# Patient Record
Sex: Female | Born: 1980 | Race: Asian | Hispanic: No | Marital: Married | Smoking: Never smoker
Health system: Southern US, Community
[De-identification: ages and names within clinical notes are randomized; demographics above are authoritative.]

## PROBLEM LIST (undated history)

## (undated) DIAGNOSIS — D649 Anemia, unspecified: Secondary | ICD-10-CM

## (undated) DIAGNOSIS — R51 Headache: Secondary | ICD-10-CM

## (undated) HISTORY — DX: Headache: R51

## (undated) HISTORY — DX: Anemia, unspecified: D64.9

---

## 2004-01-31 HISTORY — PX: TUMOR REMOVAL: SHX12

## 2010-09-03 ENCOUNTER — Emergency Department (HOSPITAL_COMMUNITY): Payer: Self-pay

## 2010-09-03 ENCOUNTER — Inpatient Hospital Stay (HOSPITAL_COMMUNITY)
Admission: EM | Admit: 2010-09-03 | Discharge: 2010-09-05 | DRG: 917 | Disposition: A | Discharge status: Home / Self Care | Payer: Self-pay | Attending: Pulmonary Disease | Admitting: Pulmonary Disease

## 2010-09-03 DIAGNOSIS — E872 Acidosis, unspecified: Secondary | ICD-10-CM | POA: Diagnosis present

## 2010-09-03 DIAGNOSIS — J9600 Acute respiratory failure, unspecified whether with hypoxia or hypercapnia: Secondary | ICD-10-CM | POA: Diagnosis present

## 2010-09-03 DIAGNOSIS — R404 Transient alteration of awareness: Secondary | ICD-10-CM | POA: Diagnosis present

## 2010-09-03 DIAGNOSIS — K2970 Gastritis, unspecified, without bleeding: Secondary | ICD-10-CM | POA: Diagnosis present

## 2010-09-03 DIAGNOSIS — T510X1A Toxic effect of ethanol, accidental (unintentional), initial encounter: Secondary | ICD-10-CM | POA: Diagnosis present

## 2010-09-03 DIAGNOSIS — D649 Anemia, unspecified: Secondary | ICD-10-CM | POA: Diagnosis present

## 2010-09-03 DIAGNOSIS — T65891A Toxic effect of other specified substances, accidental (unintentional), initial encounter: Secondary | ICD-10-CM

## 2010-09-03 DIAGNOSIS — T510X4A Toxic effect of ethanol, undetermined, initial encounter: Secondary | ICD-10-CM

## 2010-09-03 LAB — DIFFERENTIAL
Basophils Relative: 1 % (ref 0–1)
Eosinophils Relative: 3 % (ref 0–5)
Lymphs Abs: 3.5 10*3/uL (ref 0.7–4.0)
Monocytes Absolute: 0.4 10*3/uL (ref 0.1–1.0)
Monocytes Relative: 5 % (ref 3–12)
Neutro Abs: 4.5 10*3/uL (ref 1.7–7.7)

## 2010-09-03 LAB — POCT I-STAT, CHEM 8
BUN: 11 mg/dL (ref 6–23)
Calcium, Ion: 1.01 mmol/L — ABNORMAL LOW (ref 1.12–1.32)
Chloride: 113 mEq/L — ABNORMAL HIGH (ref 96–112)
Creatinine, Ser: 1 mg/dL (ref 0.50–1.10)
Glucose, Bld: 104 mg/dL — ABNORMAL HIGH (ref 70–99)
Potassium: 3.3 mEq/L — ABNORMAL LOW (ref 3.5–5.1)

## 2010-09-03 LAB — URINALYSIS, ROUTINE W REFLEX MICROSCOPIC
Bilirubin Urine: NEGATIVE
Hgb urine dipstick: NEGATIVE
Nitrite: NEGATIVE
Protein, ur: NEGATIVE mg/dL
Specific Gravity, Urine: 1.014 (ref 1.005–1.030)
Urobilinogen, UA: 0.2 mg/dL (ref 0.0–1.0)

## 2010-09-03 LAB — GLUCOSE, CAPILLARY

## 2010-09-03 LAB — BASIC METABOLIC PANEL
CO2: 16 mEq/L — ABNORMAL LOW (ref 19–32)
Chloride: 109 mEq/L (ref 96–112)
Glucose, Bld: 112 mg/dL — ABNORMAL HIGH (ref 70–99)
Potassium: 3.2 mEq/L — ABNORMAL LOW (ref 3.5–5.1)
Sodium: 142 mEq/L (ref 135–145)

## 2010-09-03 LAB — BLOOD GAS, ARTERIAL
Bicarbonate: 13.5 mEq/L — ABNORMAL LOW (ref 20.0–24.0)
Drawn by: 33686
O2 Saturation: 99.5 %
PEEP: 5 cmH2O
Patient temperature: 94.9
RATE: 18 resp/min
pH, Arterial: 7.42 — ABNORMAL HIGH (ref 7.350–7.400)
pO2, Arterial: 356 mmHg — ABNORMAL HIGH (ref 80.0–100.0)

## 2010-09-03 LAB — RAPID URINE DRUG SCREEN, HOSP PERFORMED
Amphetamines: NOT DETECTED
Barbiturates: NOT DETECTED
Benzodiazepines: NOT DETECTED
Cocaine: NOT DETECTED
Opiates: NOT DETECTED
Tetrahydrocannabinol: NOT DETECTED

## 2010-09-03 LAB — CBC
HCT: 32.5 % — ABNORMAL LOW (ref 36.0–46.0)
Hemoglobin: 10.8 g/dL — ABNORMAL LOW (ref 12.0–15.0)
MCHC: 33.2 g/dL (ref 30.0–36.0)
MCV: 65 fL — ABNORMAL LOW (ref 78.0–100.0)
RDW: 14.6 % (ref 11.5–15.5)

## 2010-09-03 LAB — SALICYLATE LEVEL: Salicylate Lvl: <2 mg/dL — ABNORMAL LOW (ref 2.8–20.0)

## 2010-09-04 DIAGNOSIS — J9600 Acute respiratory failure, unspecified whether with hypoxia or hypercapnia: Secondary | ICD-10-CM

## 2010-09-04 DIAGNOSIS — E872 Acidosis: Secondary | ICD-10-CM

## 2010-09-04 LAB — BLOOD GAS, ARTERIAL
Acid-base deficit: 7.4 mmol/L — ABNORMAL HIGH (ref 0.0–2.0)
Bicarbonate: 15.9 mEq/L — ABNORMAL LOW (ref 20.0–24.0)
Drawn by: 308601
FIO2: 0.4 %
O2 Saturation: 99.1 %
PEEP: 5 cmH2O
TCO2: 14.9 mmol/L (ref 0–100)
TCO2: 15.4 mmol/L (ref 0–100)
pCO2 arterial: 28.1 mmHg — ABNORMAL LOW (ref 35.0–45.0)
pO2, Arterial: 206 mmHg — ABNORMAL HIGH (ref 80.0–100.0)
pO2, Arterial: 214 mmHg — ABNORMAL HIGH (ref 80.0–100.0)

## 2010-09-04 LAB — BASIC METABOLIC PANEL
BUN: 4 mg/dL — ABNORMAL LOW (ref 6–23)
CO2: 22 mEq/L (ref 19–32)
Calcium: 7.3 mg/dL — ABNORMAL LOW (ref 8.4–10.5)
Calcium: 9.3 mg/dL (ref 8.4–10.5)
GFR calc non Af Amer: >60 mL/min (ref >60)
GFR calc non Af Amer: >60 mL/min (ref >60)
Glucose, Bld: 108 mg/dL — ABNORMAL HIGH (ref 70–99)
Potassium: 3.4 mEq/L — ABNORMAL LOW (ref 3.5–5.1)
Sodium: 140 mEq/L (ref 135–145)

## 2010-09-04 LAB — CBC
Hemoglobin: 9 g/dL — ABNORMAL LOW (ref 12.0–15.0)
MCH: 21.2 pg — ABNORMAL LOW (ref 26.0–34.0)
RBC: 4.25 MIL/uL (ref 3.87–5.11)

## 2010-09-04 LAB — PHOSPHORUS: Phosphorus: 3.2 mg/dL (ref 2.3–4.6)

## 2010-09-04 LAB — MAGNESIUM: Magnesium: 1.5 mg/dL (ref 1.5–2.5)

## 2010-09-04 LAB — LACTIC ACID, PLASMA: Lactic Acid, Venous: 3.3 mmol/L — ABNORMAL HIGH (ref 0.5–2.2)

## 2010-09-04 LAB — GLUCOSE, CAPILLARY: Glucose-Capillary: 100 mg/dL — ABNORMAL HIGH (ref 70–99)

## 2010-09-04 LAB — MRSA PCR SCREENING: MRSA by PCR: NEGATIVE

## 2010-09-04 LAB — PROTIME-INR
INR: 1.25 (ref 0.00–1.49)
Prothrombin Time: 16 seconds — ABNORMAL HIGH (ref 11.6–15.2)

## 2010-09-05 LAB — BLOOD GAS, ARTERIAL
Bicarbonate: 13.1 mEq/L — ABNORMAL LOW (ref 20.0–24.0)
Drawn by: 336861
PEEP: 5 cmH2O

## 2010-10-27 NOTE — Discharge Summary (Signed)
  [name], [name] NO.:  [id number]  MEDICAL RECORD NO.:  [id number]  LOCATION:  1237                         FACILITY:  [facility]  PHYSICIAN:  Oretha Milch, MD      DATE OF BIRTH:  1980/10/18  DATE OF ADMISSION:  09/03/2010 DATE OF DISCHARGE:  09/05/2010                              DISCHARGE SUMMARY   DISCHARGE DIAGNOSIS:  Acute respiratory failure secondary to alcohol overdose.  HISTORY OF PRESENT ILLNESS:  A 30 year old female with no significant past medical history.  She was brought in by her friends due to vomiting, nonresponsive after drinking vodka at a bachelor party.  Lines were none.  Antibiotics were none.  She has no known health problems prior to admission.  Hemoglobin 9.0, hematocrit 27.6.  PT is 16.0. Bicarbonate 16.  Glucose 154, calcium 7.3, and ionized calcium is 1.01. Lactate is 3.3.  WBC is 5.8, platelets are 183.  INR is 1.25, PTT is 29. Sodium 140, potassium 3.7, chloride 109.  BUN is 8, creatinine 0.63. Magnesium is 1.5, phosphorus 3.2, acetaminophen is less than 15.  ABG is pH 7.302, pCO2 of 20, and paO2 of 214, bicarbonate of 16.3.  HOSPITAL COURSE BY DISCHARGE DIAGNOSES: 1. Acute respiratory failure secondary to alcohol toxication.  She was     liberated mechanical respiratory support in less than 12 hours and     ready for discharge home. 2. Altered mental status.  This was secondary to ethanol overdose     which resolved. 3. Metabolic acidosis which resolved with rehydration. 4. Vomiting which resolved after being extubated.  DISCHARGE MEDICATIONS:  None.  DISCHARGE INSTRUCTIONS:  She has been instructed not to drink heavily. She is discharged on improved conditions, on a regular diet.     Devra Dopp, MSN, ACNP   ______________________________ Oretha Milch, MD    SM/MEDQ  D:  10/11/2010  T:  10/11/2010  Job:  [id number]  Electronically Signed by Devra Dopp MSN ACNP on 10/15/2010 01:06:45 PM Electronically Signed  by Cyril Mourning MD on 10/27/2010 03:32:38 PM

## 2012-09-02 LAB — US OB COMP LESS 14 WKS

## 2012-09-09 LAB — OB RESULTS CONSOLE HEPATITIS B SURFACE ANTIGEN: Hepatitis B Surface Ag: NEGATIVE

## 2012-09-09 LAB — OB RESULTS CONSOLE GC/CHLAMYDIA
CHLAMYDIA, DNA PROBE: NEGATIVE
GC PROBE AMP, GENITAL: NEGATIVE

## 2012-09-09 LAB — OB RESULTS CONSOLE RPR: RPR: NONREACTIVE

## 2012-09-09 LAB — OB RESULTS CONSOLE RUBELLA ANTIBODY, IGM: RUBELLA: IMMUNE

## 2012-09-09 LAB — OB RESULTS CONSOLE ABO/RH: RH Type: POSITIVE

## 2012-09-09 LAB — OB RESULTS CONSOLE ANTIBODY SCREEN: Antibody Screen: NEGATIVE

## 2012-09-09 LAB — OB RESULTS CONSOLE HIV ANTIBODY (ROUTINE TESTING): HIV: NONREACTIVE

## 2012-10-02 LAB — US OB COMP LESS 14 WKS

## 2012-10-04 ENCOUNTER — Telehealth: Payer: Self-pay | Admitting: Hematology & Oncology

## 2012-10-04 NOTE — Telephone Encounter (Signed)
Called pt to schedule appointment she was not aware of referral. She took down my information and said she would call me after she talks to referring. I left message on referral contact voice mail that pt is not aware of referral.

## 2012-10-07 ENCOUNTER — Telehealth: Payer: Self-pay | Admitting: Hematology & Oncology

## 2012-10-07 NOTE — Telephone Encounter (Signed)
Pt aware of 9-24 appointment °

## 2012-10-14 ENCOUNTER — Telehealth: Payer: Self-pay | Admitting: Hematology & Oncology

## 2012-10-14 NOTE — Telephone Encounter (Signed)
Left pt message moved 9-24 to 9-29

## 2012-10-23 ENCOUNTER — Ambulatory Visit: Payer: Self-pay | Admitting: Hematology & Oncology

## 2012-10-23 ENCOUNTER — Other Ambulatory Visit: Payer: Self-pay | Admitting: Lab

## 2012-10-23 ENCOUNTER — Ambulatory Visit: Payer: Self-pay

## 2012-10-28 ENCOUNTER — Other Ambulatory Visit (HOSPITAL_BASED_OUTPATIENT_CLINIC_OR_DEPARTMENT_OTHER): Payer: Self-pay | Admitting: Lab

## 2012-10-28 ENCOUNTER — Ambulatory Visit (HOSPITAL_BASED_OUTPATIENT_CLINIC_OR_DEPARTMENT_OTHER): Payer: BC Managed Care – PPO | Admitting: Hematology & Oncology

## 2012-10-28 ENCOUNTER — Ambulatory Visit: Payer: BC Managed Care – PPO

## 2012-10-28 VITALS — BP 134/57 | HR 70 | Temp 98.4°F | Resp 14 | Ht 64.0 in | Wt 154.0 lb

## 2012-10-28 DIAGNOSIS — D569 Thalassemia, unspecified: Secondary | ICD-10-CM

## 2012-10-28 DIAGNOSIS — Z331 Pregnant state, incidental: Secondary | ICD-10-CM

## 2012-10-28 DIAGNOSIS — D563 Thalassemia minor: Secondary | ICD-10-CM

## 2012-10-28 LAB — CBC WITH DIFFERENTIAL (CANCER CENTER ONLY)
Eosinophils Absolute: 0.2 10*3/uL (ref 0.0–0.5)
LYMPH#: 1.8 10*3/uL (ref 0.9–3.3)
MCH: 21.7 pg — ABNORMAL LOW (ref 26.0–34.0)
MCV: 66 fL — ABNORMAL LOW (ref 81–101)
MONO#: 0.7 10*3/uL (ref 0.1–0.9)
MONO%: 5.9 % (ref 0.0–13.0)
NEUT#: 9.4 10*3/uL — ABNORMAL HIGH (ref 1.5–6.5)
Platelets: 280 10*3/uL (ref 145–400)
RBC: 4.71 10*6/uL (ref 3.70–5.32)
WBC: 12.1 10*3/uL — ABNORMAL HIGH (ref 3.9–10.0)

## 2012-10-28 LAB — TECHNOLOGIST REVIEW CHCC SATELLITE

## 2012-10-28 NOTE — Progress Notes (Signed)
This office note has been dictated.

## 2012-10-29 NOTE — Progress Notes (Signed)
CC:   Guy Sandifer. Henderson Cloud, M.D.  DIAGNOSES: 1. Sixteen weeks pregnant. 2. Beta thalassemia minor.  HISTORY OF PRESENT ILLNESS:  Kaylee [name] is a very charming 32 year old female from [place].  She is married.  She and her husband have been married for about 2 years.  She is from [place] originally.  Her father apparently has thalassemia.  As far as I can tell, this is thalassemia major.  From what she says, he has had a lot of transfusions.  He has iron overload.  He is having a lot of health issues right now.  She is followed by Dr. Henderson Cloud.  She is [redacted] weeks pregnant.  She has never had any kind of blood issues.  She has never been told that she has been anemic.  She has had normal monthly cycles.  These have been okay.  She has had no problems with rashes.  There has been no bony pain.  She has had no headaches.  There have been no swallowing difficulties.  She does not chew ice.  The patient currently takes prenatal vitamins without difficulty.  Dr. Henderson Cloud wanted her to be seen by Hematology because of this history of thalassemia in the family.  She has 2, I think, sisters and a brother.  As far she knows, they have had no problems.  Back in August, she did have a hemoglobin electrophoresis done.  This showed a hemoglobin A of 95%.  She had a minimally elevated hemoglobin A2 level of 4.7%.  There was no hemoglobin S.  She was checked for the routine hepatitis studies.  She is negative for hepatitis.  She is negative for HIV.  Again, she has had no issues with respect to her blood as far she knows.  She works as a Social worker.  She has had no problems with increase in fatigue.  There has been no cough.  She has had no shortness of breath.  She is not a vegetarian.  Again, she is [redacted] weeks pregnant.  She is due in March.  So far, the pregnancy has gone unaffected.  Her past medical history is relatively unremarkable.  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  ALLERGIES:  None.  CURRENT MEDICATIONS:  Prenatal vitamins daily.  SOCIAL HISTORY:  Negative for tobacco use.  There was rare alcohol use prior to pregnancy.  There is no recreational drug use.  FAMILY HISTORY:  Remarkable for her father with thalassemia.  She says her mother, who is also from [place], is okay as far she knows.  Her siblings are healthy.  REVIEW OF SYSTEMS:  As stated in the history of present illness.  A 12- system review is negative.  PHYSICAL EXAMINATION:  General:  This is a well-developed, well- nourished Asian female in no obvious distress.  Vital signs: Temperature of 98.4, pulse 70, respiratory rate 14, blood pressure 134/57.  Weight is 154 pounds.  Head and neck:  Normocephalic, atraumatic skull.  There are no ocular or oral lesions.  There are no palpable cervical or supraclavicular lymph nodes.  Lungs:  Clear bilaterally.  Cardiac:  Regular rate and rhythm with a normal S1 and S2. There are no murmurs, rubs, or bruits.  Abdomen:  Soft.  One cannot even tell that she is pregnant as of yet.  There is no fluid wave.  There is no guarding or rebound tenderness.  She has no palpable  hepatosplenomegaly.  Back:  No tenderness over the spine, ribs, or hips. Extremities:  No clubbing, cyanosis, or edema.  She has good range of motion of her joints.  She has good strength in her arms and legs. Skin:  No rashes, ecchymoses, or petechiae.  Neurological:  No focal neurological deficits.  LABORATORY STUDIES:  White cell count is 12.1, hemoglobin 10.2, hematocrit 31, platelet count 280,000.  MCV is 66.  Reticulocyte count is 1.8% (corrected).  Peripheral smear, which I reviewed, shows a microcytic population of red blood cells.  There are some hypochromic red cells.  I do see some target cells.  I see no rouleaux formation.  There are no schistocytes. There are no spherocytes.  I see no inclusion bodies.  She  has no teardrop cells.  White cells appear normal in morphology and maturation. White cells might be slightly increased in number; however, they appear mature.  I see no immature myeloid cells.  There are no atypical lymphocytes.  I see no blasts.  Platelets are adequate in number and size.  IMPRESSION:  Ms. Haver is a very charming 32 year old [place] female.  She has heterozygote thalassemia minor.  This is beta thalassemia.  She is asymptomatic with this.  I really do not see any issues with her and the pregnancy.  With her husband, being Caucasian, her child should have very little, if any, hemoglobin A2.  As such, I do not see any issues with respect to her infant and thalassemia.  She may be iron deficient.  I am checking some iron studies on her.  I was not too impressed with her blood smear.  I do not see a lot of target cells.  She had a lot of hypochromic, microcytic cells.  The fact that her red cell distribution width is normal would make iron deficiency less likely.  Typically, with iron deficiency, the red cell distribution width is increased.  From my point of view, I do not think we have to see her back.  I just do not believe that the thalassemia minor is going to be an issue for her.  Her thalassemia hemoglobin (A2) is minimal at best.  I think the big thing with Ms. [place] is that she take her prenatal vitamins.  After her pregnancy, I would have her take folic acid 1 mg a day which she can just take over-the-counter.  She has monthly phlebotomies, so that clearly is going to take care of any iron issues that she may have from ineffective erythropoiesis from the thalassemia.  Again, I think this would be a very minor component for her.  I suspect that she always will be anemic.  However, this anemia as well tolerated by her.  She is asymptomatic with this.  It would not surprise me if her hemoglobin went down with her pregnancy. Again, I do not  see that the thalassemia minor is going to be a factor with respect to any kind of blood support.  I spent a good hour and 20 minutes with Mr. Leon and her husband. I reviewed the lab work with him.  I went over thalassemia with them, explained to them what beta thalassemia was.  I reassured her that I just did not think that she was going to have any problems with beta thalassemia, although she may always have some degree of anemia that she should not be symptomatic from.    ______________________________ Josph Macho, M.D. PRE/MEDQ  D:  10/28/2012  T:  10/29/2012  Job:  [id number]

## 2012-10-30 LAB — HEMOGLOBINOPATHY EVALUATION
Hgb A2 Quant: 4.8 % — ABNORMAL HIGH (ref 2.2–3.2)
Hgb A: 94.4 % — ABNORMAL LOW (ref 96.8–97.8)
Hgb F Quant: 0.8 % (ref 0.0–2.0)
Hgb S Quant: 0 %

## 2012-10-30 LAB — FOLATE RBC: RBC Folate: 1264 ng/mL

## 2012-10-30 LAB — RETICULOCYTES (CHCC)
ABS Retic: 96.2 K/uL (ref 19.0–186.0)
RBC.: 4.81 MIL/uL (ref 3.87–5.11)
Retic Ct Pct: 2 % (ref 0.4–2.3)

## 2013-03-20 LAB — OB RESULTS CONSOLE GBS: GBS: NEGATIVE

## 2013-04-14 ENCOUNTER — Inpatient Hospital Stay (HOSPITAL_COMMUNITY): Admission: AD | Admit: 2013-04-14 | Payer: Self-pay | Source: Ambulatory Visit | Admitting: Obstetrics and Gynecology

## 2013-04-14 ENCOUNTER — Telehealth (HOSPITAL_COMMUNITY): Payer: Self-pay | Admitting: *Deleted

## 2013-04-14 ENCOUNTER — Encounter (HOSPITAL_COMMUNITY): Payer: Self-pay | Admitting: *Deleted

## 2013-04-14 NOTE — Telephone Encounter (Signed)
Preadmission screen  

## 2013-04-16 ENCOUNTER — Encounter (HOSPITAL_COMMUNITY): Payer: Self-pay

## 2013-04-16 ENCOUNTER — Encounter (HOSPITAL_COMMUNITY)
Admission: RE | Disposition: A | Discharge status: Home / Self Care | Payer: Self-pay | Source: Ambulatory Visit | Attending: Obstetrics and Gynecology

## 2013-04-16 ENCOUNTER — Encounter (HOSPITAL_COMMUNITY): Payer: BC Managed Care – PPO | Admitting: Anesthesiology

## 2013-04-16 ENCOUNTER — Encounter (HOSPITAL_COMMUNITY): Payer: Self-pay | Admitting: Anesthesiology

## 2013-04-16 ENCOUNTER — Inpatient Hospital Stay (HOSPITAL_COMMUNITY): Payer: BC Managed Care – PPO | Admitting: Anesthesiology

## 2013-04-16 ENCOUNTER — Inpatient Hospital Stay (HOSPITAL_COMMUNITY)
Admission: RE | Admit: 2013-04-16 | Discharge: 2013-04-18 | DRG: 766 | Disposition: A | Discharge status: Home / Self Care | Payer: BC Managed Care – PPO | Source: Ambulatory Visit | Attending: Obstetrics and Gynecology | Admitting: Obstetrics and Gynecology

## 2013-04-16 DIAGNOSIS — O324XX0 Maternal care for high head at term, not applicable or unspecified: Secondary | ICD-10-CM | POA: Diagnosis present

## 2013-04-16 LAB — CBC
HCT: 33.4 % — ABNORMAL LOW (ref 36.0–46.0)
Hemoglobin: 10.8 g/dL — ABNORMAL LOW (ref 12.0–15.0)
MCH: 22.1 pg — AB (ref 26.0–34.0)
MCHC: 32.3 g/dL (ref 30.0–36.0)
MCV: 68.3 fL — AB (ref 78.0–100.0)
PLATELETS: 253 10*3/uL (ref 150–400)
RBC: 4.89 MIL/uL (ref 3.87–5.11)
RDW: 15.6 % — AB (ref 11.5–15.5)
WBC: 12.8 10*3/uL — ABNORMAL HIGH (ref 4.0–10.5)

## 2013-04-16 LAB — ABO/RH: ABO/RH(D): B POS

## 2013-04-16 LAB — TYPE AND SCREEN
ABO/RH(D): B POS
ANTIBODY SCREEN: NEGATIVE

## 2013-04-16 LAB — RPR: RPR Ser Ql: NONREACTIVE

## 2013-04-16 SURGERY — Surgical Case
Anesthesia: Epidural | Site: Abdomen

## 2013-04-16 MED ORDER — ONDANSETRON HCL 4 MG PO TABS: 4.0000 mg | ORAL_TABLET | ORAL | Status: DC | PRN

## 2013-04-16 MED ORDER — LACTATED RINGERS IV SOLN: 500.0000 mL | INTRAVENOUS | Status: DC | PRN

## 2013-04-16 MED ORDER — CITRIC ACID-SODIUM CITRATE 334-500 MG/5ML PO SOLN
30.0000 mL | ORAL | Status: DC | PRN
  Administered 2013-04-16: 30 mL via ORAL
  Filled 2013-04-16: qty 15

## 2013-04-16 MED ORDER — LACTATED RINGERS IV SOLN
INTRAVENOUS | Status: DC
  Administered 2013-04-17: 04:00:00 via INTRAVENOUS

## 2013-04-16 MED ORDER — OXYTOCIN 40 UNITS IN LACTATED RINGERS INFUSION - SIMPLE MED: 62.5000 mL/h | INTRAVENOUS | Status: DC

## 2013-04-16 MED ORDER — PROMETHAZINE HCL 25 MG/ML IJ SOLN: 6.2500 mg | INTRAMUSCULAR | Status: DC | PRN

## 2013-04-16 MED ORDER — TERBUTALINE SULFATE 1 MG/ML IJ SOLN: 0.2500 mg | Freq: Once | INTRAMUSCULAR | Status: DC | PRN

## 2013-04-16 MED ORDER — SCOPOLAMINE 1 MG/3DAYS TD PT72
1.0000 | MEDICATED_PATCH | Freq: Once | TRANSDERMAL | Status: DC
  Administered 2013-04-16: 1.5 mg via TRANSDERMAL

## 2013-04-16 MED ORDER — EPHEDRINE 5 MG/ML INJ: 10.0000 mg | INTRAVENOUS | Status: DC | PRN

## 2013-04-16 MED ORDER — OXYCODONE HCL 5 MG PO TABS: 5.0000 mg | ORAL_TABLET | Freq: Once | ORAL | Status: DC | PRN

## 2013-04-16 MED ORDER — MEPERIDINE HCL 25 MG/ML IJ SOLN
INTRAMUSCULAR | Status: AC
  Filled 2013-04-16: qty 1

## 2013-04-16 MED ORDER — DIPHENHYDRAMINE HCL 25 MG PO CAPS: 25.0000 mg | ORAL_CAPSULE | Freq: Four times a day (QID) | ORAL | Status: DC | PRN

## 2013-04-16 MED ORDER — FLEET ENEMA 7-19 GM/118ML RE ENEM: 1.0000 | ENEMA | RECTAL | Status: DC | PRN

## 2013-04-16 MED ORDER — SODIUM CHLORIDE 0.9 % IJ SOLN: 3.0000 mL | INTRAMUSCULAR | Status: DC | PRN

## 2013-04-16 MED ORDER — KETOROLAC TROMETHAMINE 30 MG/ML IJ SOLN: 30.0000 mg | Freq: Four times a day (QID) | INTRAMUSCULAR | Status: AC | PRN

## 2013-04-16 MED ORDER — MORPHINE SULFATE (PF) 0.5 MG/ML IJ SOLN
INTRAMUSCULAR | Status: DC | PRN
  Administered 2013-04-16: 3 mg via EPIDURAL

## 2013-04-16 MED ORDER — ONDANSETRON HCL 4 MG/2ML IJ SOLN: 4.0000 mg | Freq: Four times a day (QID) | INTRAMUSCULAR | Status: DC | PRN

## 2013-04-16 MED ORDER — SIMETHICONE 80 MG PO CHEW
80.0000 mg | CHEWABLE_TABLET | ORAL | Status: DC
  Administered 2013-04-17 – 2013-04-18 (×2): 80 mg via ORAL
  Filled 2013-04-16 (×2): qty 1

## 2013-04-16 MED ORDER — FENTANYL 2.5 MCG/ML BUPIVACAINE 1/10 % EPIDURAL INFUSION (WH - ANES)
14.0000 mL/h | INTRAMUSCULAR | Status: DC | PRN
  Filled 2013-04-16: qty 125

## 2013-04-16 MED ORDER — PHENYLEPHRINE 40 MCG/ML (10ML) SYRINGE FOR IV PUSH (FOR BLOOD PRESSURE SUPPORT): 80.0000 ug | PREFILLED_SYRINGE | INTRAVENOUS | Status: DC | PRN

## 2013-04-16 MED ORDER — PRENATAL MULTIVITAMIN CH
1.0000 | ORAL_TABLET | Freq: Every day | ORAL | Status: DC
  Administered 2013-04-17: 1 via ORAL
  Filled 2013-04-16: qty 1

## 2013-04-16 MED ORDER — OXYTOCIN 40 UNITS IN LACTATED RINGERS INFUSION - SIMPLE MED: 62.5000 mL/h | INTRAVENOUS | Status: AC

## 2013-04-16 MED ORDER — SODIUM BICARBONATE 8.4 % IV SOLN
INTRAVENOUS | Status: AC
  Filled 2013-04-16: qty 50

## 2013-04-16 MED ORDER — NALBUPHINE HCL 10 MG/ML IJ SOLN: 5.0000 mg | INTRAMUSCULAR | Status: DC | PRN

## 2013-04-16 MED ORDER — FENTANYL 2.5 MCG/ML BUPIVACAINE 1/10 % EPIDURAL INFUSION (WH - ANES)
INTRAMUSCULAR | Status: DC | PRN
  Administered 2013-04-16: 14 mL/h via EPIDURAL

## 2013-04-16 MED ORDER — IBUPROFEN 600 MG PO TABS
600.0000 mg | ORAL_TABLET | Freq: Four times a day (QID) | ORAL | Status: DC | PRN
Start: 2013-04-16 — End: 2013-04-16

## 2013-04-16 MED ORDER — IBUPROFEN 600 MG PO TABS
600.0000 mg | ORAL_TABLET | Freq: Four times a day (QID) | ORAL | Status: DC
  Administered 2013-04-17 – 2013-04-18 (×5): 600 mg via ORAL
  Filled 2013-04-16 (×5): qty 1

## 2013-04-16 MED ORDER — LIDOCAINE HCL (PF) 1 % IJ SOLN
INTRAMUSCULAR | Status: DC | PRN
  Administered 2013-04-16 (×2): 9 mL

## 2013-04-16 MED ORDER — METOCLOPRAMIDE HCL 5 MG/ML IJ SOLN: 10.0000 mg | Freq: Three times a day (TID) | INTRAMUSCULAR | Status: DC | PRN

## 2013-04-16 MED ORDER — LACTATED RINGERS IV SOLN
INTRAVENOUS | Status: DC | PRN
  Administered 2013-04-16 (×2): via INTRAVENOUS

## 2013-04-16 MED ORDER — OXYCODONE-ACETAMINOPHEN 5-325 MG PO TABS: 1.0000 | ORAL_TABLET | ORAL | Status: DC | PRN

## 2013-04-16 MED ORDER — NALOXONE HCL 0.4 MG/ML IJ SOLN: 0.4000 mg | INTRAMUSCULAR | Status: DC | PRN

## 2013-04-16 MED ORDER — MORPHINE SULFATE 0.5 MG/ML IJ SOLN
INTRAMUSCULAR | Status: AC
  Filled 2013-04-16: qty 10

## 2013-04-16 MED ORDER — DIBUCAINE 1 % RE OINT: 1.0000 "application " | TOPICAL_OINTMENT | RECTAL | Status: DC | PRN

## 2013-04-16 MED ORDER — KETOROLAC TROMETHAMINE 60 MG/2ML IM SOLN
60.0000 mg | Freq: Once | INTRAMUSCULAR | Status: AC | PRN
  Administered 2013-04-16: 60 mg via INTRAMUSCULAR

## 2013-04-16 MED ORDER — DIPHENHYDRAMINE HCL 50 MG/ML IJ SOLN: 12.5000 mg | INTRAMUSCULAR | Status: DC | PRN

## 2013-04-16 MED ORDER — DIPHENHYDRAMINE HCL 50 MG/ML IJ SOLN: 25.0000 mg | INTRAMUSCULAR | Status: DC | PRN

## 2013-04-16 MED ORDER — LACTATED RINGERS IV SOLN: 500.0000 mL | Freq: Once | INTRAVENOUS | Status: DC

## 2013-04-16 MED ORDER — OXYTOCIN 40 UNITS IN LACTATED RINGERS INFUSION - SIMPLE MED
1.0000 m[IU]/min | INTRAVENOUS | Status: DC
  Administered 2013-04-16: 2 m[IU]/min via INTRAVENOUS
  Filled 2013-04-16: qty 1000

## 2013-04-16 MED ORDER — LIDOCAINE-EPINEPHRINE (PF) 2 %-1:200000 IJ SOLN
INTRAMUSCULAR | Status: AC
  Filled 2013-04-16: qty 20

## 2013-04-16 MED ORDER — ACETAMINOPHEN 325 MG PO TABS: 650.0000 mg | ORAL_TABLET | ORAL | Status: DC | PRN

## 2013-04-16 MED ORDER — LACTATED RINGERS IV SOLN
INTRAVENOUS | Status: DC
  Administered 2013-04-16 (×4): via INTRAVENOUS

## 2013-04-16 MED ORDER — SCOPOLAMINE 1 MG/3DAYS TD PT72
MEDICATED_PATCH | TRANSDERMAL | Status: AC
  Filled 2013-04-16: qty 1

## 2013-04-16 MED ORDER — SENNOSIDES-DOCUSATE SODIUM 8.6-50 MG PO TABS
2.0000 | ORAL_TABLET | ORAL | Status: DC
  Administered 2013-04-17 – 2013-04-18 (×2): 2 via ORAL
  Filled 2013-04-16 (×2): qty 2

## 2013-04-16 MED ORDER — OXYTOCIN 10 UNIT/ML IJ SOLN
INTRAMUSCULAR | Status: AC
  Filled 2013-04-16: qty 4

## 2013-04-16 MED ORDER — WITCH HAZEL-GLYCERIN EX PADS: 1.0000 "application " | MEDICATED_PAD | CUTANEOUS | Status: DC | PRN

## 2013-04-16 MED ORDER — MENTHOL 3 MG MT LOZG: 1.0000 | LOZENGE | OROMUCOSAL | Status: DC | PRN

## 2013-04-16 MED ORDER — OXYTOCIN BOLUS FROM INFUSION
500.0000 mL | INTRAVENOUS | Status: DC
Start: 2013-04-16 — End: 2013-04-16

## 2013-04-16 MED ORDER — TETANUS-DIPHTH-ACELL PERTUSSIS 5-2.5-18.5 LF-MCG/0.5 IM SUSP: 0.5000 mL | Freq: Once | INTRAMUSCULAR | Status: DC

## 2013-04-16 MED ORDER — SIMETHICONE 80 MG PO CHEW
80.0000 mg | CHEWABLE_TABLET | ORAL | Status: DC | PRN
  Filled 2013-04-16: qty 1

## 2013-04-16 MED ORDER — CEFAZOLIN SODIUM-DEXTROSE 2-3 GM-% IV SOLR: 2.0000 g | INTRAVENOUS | Status: DC

## 2013-04-16 MED ORDER — ONDANSETRON HCL 4 MG/2ML IJ SOLN
INTRAMUSCULAR | Status: DC | PRN
  Administered 2013-04-16: 4 mg via INTRAVENOUS

## 2013-04-16 MED ORDER — ONDANSETRON HCL 4 MG/2ML IJ SOLN: 4.0000 mg | INTRAMUSCULAR | Status: DC | PRN

## 2013-04-16 MED ORDER — OXYTOCIN 10 UNIT/ML IJ SOLN
40.0000 [IU] | INTRAVENOUS | Status: DC | PRN
  Administered 2013-04-16: 40 [IU] via INTRAVENOUS

## 2013-04-16 MED ORDER — NALOXONE HCL 1 MG/ML IJ SOLN
1.0000 ug/kg/h | INTRAMUSCULAR | Status: DC | PRN
  Filled 2013-04-16: qty 2

## 2013-04-16 MED ORDER — MEPERIDINE HCL 25 MG/ML IJ SOLN
6.2500 mg | INTRAMUSCULAR | Status: DC | PRN
  Administered 2013-04-16: 6.25 mg via INTRAVENOUS

## 2013-04-16 MED ORDER — NALBUPHINE HCL 10 MG/ML IJ SOLN
5.0000 mg | INTRAMUSCULAR | Status: DC | PRN
  Administered 2013-04-17 (×2): 10 mg via SUBCUTANEOUS
  Filled 2013-04-16 (×2): qty 1

## 2013-04-16 MED ORDER — MEPERIDINE HCL 25 MG/ML IJ SOLN
INTRAMUSCULAR | Status: DC | PRN
Start: 2013-04-16 — End: 2013-04-16
  Administered 2013-04-16: 12.5 mg via INTRAVENOUS

## 2013-04-16 MED ORDER — ONDANSETRON HCL 4 MG/2ML IJ SOLN
INTRAMUSCULAR | Status: AC
  Filled 2013-04-16: qty 2

## 2013-04-16 MED ORDER — OXYCODONE HCL 5 MG/5ML PO SOLN
5.0000 mg | Freq: Once | ORAL | Status: DC | PRN
  Filled 2013-04-16: qty 5

## 2013-04-16 MED ORDER — SODIUM BICARBONATE 8.4 % IV SOLN
INTRAVENOUS | Status: DC | PRN
  Administered 2013-04-16: 4 mL via EPIDURAL
  Administered 2013-04-16: 10 mL via EPIDURAL

## 2013-04-16 MED ORDER — CEFAZOLIN SODIUM-DEXTROSE 2-3 GM-% IV SOLR
INTRAVENOUS | Status: DC | PRN
  Administered 2013-04-16: 2 g via INTRAVENOUS

## 2013-04-16 MED ORDER — HYDROMORPHONE HCL PF 1 MG/ML IJ SOLN: 0.2500 mg | INTRAMUSCULAR | Status: DC | PRN

## 2013-04-16 MED ORDER — DIPHENHYDRAMINE HCL 25 MG PO CAPS: 25.0000 mg | ORAL_CAPSULE | ORAL | Status: DC | PRN

## 2013-04-16 MED ORDER — SIMETHICONE 80 MG PO CHEW
80.0000 mg | CHEWABLE_TABLET | Freq: Three times a day (TID) | ORAL | Status: DC
  Administered 2013-04-17 – 2013-04-18 (×3): 80 mg via ORAL
  Filled 2013-04-16 (×3): qty 1

## 2013-04-16 MED ORDER — CEFAZOLIN SODIUM-DEXTROSE 2-3 GM-% IV SOLR
INTRAVENOUS | Status: AC
  Filled 2013-04-16: qty 50

## 2013-04-16 MED ORDER — ZOLPIDEM TARTRATE 5 MG PO TABS: 5.0000 mg | ORAL_TABLET | Freq: Every evening | ORAL | Status: DC | PRN

## 2013-04-16 MED ORDER — BUPIVACAINE LIPOSOME 1.3 % IJ SUSP
20.0000 mL | Freq: Once | INTRAMUSCULAR | Status: AC
  Administered 2013-04-16: 20 mL
  Filled 2013-04-16: qty 20

## 2013-04-16 MED ORDER — LIDOCAINE HCL (PF) 1 % IJ SOLN: 30.0000 mL | INTRAMUSCULAR | Status: DC | PRN

## 2013-04-16 MED ORDER — SODIUM CHLORIDE 0.9 % IJ SOLN
INTRAMUSCULAR | Status: AC
Start: 2013-04-16 — End: 2013-04-16
  Filled 2013-04-16: qty 20

## 2013-04-16 MED ORDER — ONDANSETRON HCL 4 MG/2ML IJ SOLN: 4.0000 mg | Freq: Three times a day (TID) | INTRAMUSCULAR | Status: DC | PRN

## 2013-04-16 MED ORDER — LANOLIN HYDROUS EX OINT: 1.0000 "application " | TOPICAL_OINTMENT | CUTANEOUS | Status: DC | PRN

## 2013-04-16 MED ORDER — EPHEDRINE 5 MG/ML INJ
10.0000 mg | INTRAVENOUS | Status: DC | PRN
  Filled 2013-04-16: qty 4

## 2013-04-16 MED ORDER — PHENYLEPHRINE 40 MCG/ML (10ML) SYRINGE FOR IV PUSH (FOR BLOOD PRESSURE SUPPORT)
80.0000 ug | PREFILLED_SYRINGE | INTRAVENOUS | Status: DC | PRN
  Filled 2013-04-16: qty 10

## 2013-04-16 MED ORDER — SODIUM CHLORIDE 0.9 % IJ SOLN
INTRAMUSCULAR | Status: DC | PRN
  Administered 2013-04-16: 20 mL via INTRAVENOUS

## 2013-04-16 MED ORDER — MEPERIDINE HCL 25 MG/ML IJ SOLN
6.2500 mg | INTRAMUSCULAR | Status: DC | PRN
Start: 2013-04-16 — End: 2013-04-16

## 2013-04-16 MED ORDER — KETOROLAC TROMETHAMINE 60 MG/2ML IM SOLN
INTRAMUSCULAR | Status: AC
  Filled 2013-04-16: qty 2

## 2013-04-16 MED ORDER — PHENYLEPHRINE 40 MCG/ML (10ML) SYRINGE FOR IV PUSH (FOR BLOOD PRESSURE SUPPORT)
PREFILLED_SYRINGE | INTRAVENOUS | Status: AC
  Filled 2013-04-16: qty 5

## 2013-04-16 SURGICAL SUPPLY — 35 items
BENZOIN TINCTURE PRP APPL 2/3 (GAUZE/BANDAGES/DRESSINGS) ×3 IMPLANT
CLAMP CORD UMBIL (MISCELLANEOUS) IMPLANT
CLOSURE WOUND 1/2 X4 (GAUZE/BANDAGES/DRESSINGS) ×1
CLOTH BEACON ORANGE TIMEOUT ST (SAFETY) ×3 IMPLANT
CONTAINER PREFILL 10% NBF 15ML (MISCELLANEOUS) IMPLANT
DERMABOND ADVANCED (GAUZE/BANDAGES/DRESSINGS)
DERMABOND ADVANCED .7 DNX12 (GAUZE/BANDAGES/DRESSINGS) IMPLANT
DRAPE LG THREE QUARTER DISP (DRAPES) ×3 IMPLANT
DRSG OPSITE POSTOP 4X10 (GAUZE/BANDAGES/DRESSINGS) ×3 IMPLANT
DURAPREP 26ML APPLICATOR (WOUND CARE) ×3 IMPLANT
ELECT REM PT RETURN 9FT ADLT (ELECTROSURGICAL) ×3
ELECTRODE REM PT RTRN 9FT ADLT (ELECTROSURGICAL) ×1 IMPLANT
EXTRACTOR VACUUM M CUP 4 TUBE (SUCTIONS) IMPLANT
EXTRACTOR VACUUM M CUP 4' TUBE (SUCTIONS)
GLOVE BIO SURGEON STRL SZ7.5 (GLOVE) ×3 IMPLANT
GOWN STRL REUS W/TWL LRG LVL3 (GOWN DISPOSABLE) ×6 IMPLANT
KIT ABG SYR 3ML LUER SLIP (SYRINGE) ×3 IMPLANT
NEEDLE HYPO 21X1.5 SAFETY (NEEDLE) ×3 IMPLANT
NEEDLE HYPO 25X5/8 SAFETYGLIDE (NEEDLE) ×3 IMPLANT
NS IRRIG 1000ML POUR BTL (IV SOLUTION) ×3 IMPLANT
PACK C SECTION WH (CUSTOM PROCEDURE TRAY) ×3 IMPLANT
PAD OB MATERNITY 4.3X12.25 (PERSONAL CARE ITEMS) ×3 IMPLANT
STAPLER VISISTAT 35W (STAPLE) IMPLANT
STRIP CLOSURE SKIN 1/2X4 (GAUZE/BANDAGES/DRESSINGS) ×2 IMPLANT
SUT MNCRL 0 VIOLET CTX 36 (SUTURE) ×5 IMPLANT
SUT MONOCRYL 0 CTX 36 (SUTURE) ×10
SUT PDS AB 0 CTX 60 (SUTURE) ×3 IMPLANT
SUT PLAIN 0 NONE (SUTURE) IMPLANT
SUT PLAIN 2 0 (SUTURE) ×2
SUT PLAIN ABS 2-0 CT1 27XMFL (SUTURE) ×1 IMPLANT
SUT VIC AB 4-0 KS 27 (SUTURE) ×3 IMPLANT
SYR 20CC LL (SYRINGE) ×3 IMPLANT
TOWEL OR 17X24 6PK STRL BLUE (TOWEL DISPOSABLE) ×3 IMPLANT
TRAY FOLEY CATH 14FR (SET/KITS/TRAYS/PACK) IMPLANT
WATER STERILE IRR 1000ML POUR (IV SOLUTION) IMPLANT

## 2013-04-16 NOTE — Progress Notes (Signed)
Cx rim 9:00 to 12:00/C/0/LOT FHT reactive UCs q2-3 min

## 2013-04-16 NOTE — Transfer of Care (Signed)
Immediate Anesthesia Transfer of Care Note  Patient: Kaylee [name]  Procedure(s) Performed: Procedure(s): CESAREAN SECTION (N/A)  Patient Location: PACU  Anesthesia Type:Epidural  Level of Consciousness: awake, alert , oriented and patient cooperative  Airway & Oxygen Therapy: Patient Spontanous Breathing  Post-op Assessment: Report given to PACU RN and Post -op Vital signs reviewed and stable  Post vital signs: Reviewed and stable  Complications: No apparent anesthesia complications

## 2013-04-16 NOTE — Progress Notes (Signed)
Cx 4-5/90/-1/vtx AROM clear D/W patient pitocin augmentation if needed

## 2013-04-16 NOTE — Progress Notes (Signed)
cx rim/-1/LOT  No change in 2.5-3 hours  IUPC was placed and UCs adequate FHT reactive  A: Arrest of descent  P: Rec: C/S - D/W procedure and risks including infection, organ damage, bleeding/transfusion-HIV/Hep, DVT/PE, pneumonia. All questions answered.

## 2013-04-16 NOTE — Anesthesia Procedure Notes (Signed)
Epidural Patient location during procedure: OB Start time: 04/16/2013 11:22 AM End time: 04/16/2013 11:26 AM  Staffing Anesthesiologist: Leilani AbleHATCHETT, Loree Shehata Performed by: anesthesiologist   Preanesthetic Checklist Completed: patient identified, surgical consent, pre-op evaluation, timeout performed, IV checked, risks and benefits discussed and monitors and equipment checked  Epidural Patient position: sitting Prep: site prepped and draped and DuraPrep Patient monitoring: continuous pulse ox and blood pressure Approach: midline Location: L3-L4 Injection technique: LOR air  Needle:  Needle type: Tuohy  Needle gauge: 17 G Needle length: 9 cm and 9 Needle insertion depth: 7 cm Catheter type: closed end flexible Catheter size: 19 Gauge Catheter at skin depth: 12 cm Test dose: negative and Other  Assessment Sensory level: T9 Events: blood not aspirated, injection not painful, no injection resistance, negative IV test and no paresthesia  Additional Notes Reason for block:procedure for pain

## 2013-04-16 NOTE — Progress Notes (Signed)
Cx 5 cm per nurse check Epidural placed Pitocin on UCs q2-3 min FHT reactive

## 2013-04-16 NOTE — Brief Op Note (Signed)
04/16/2013  7:54 PM  PATIENT:  Kaylee [name]  33 y.o. female  PRE-OPERATIVE DIAGNOSIS:  ARREST OF DESEND  POST-OPERATIVE DIAGNOSIS:  ARREST OF DESEND  PROCEDURE:  Procedure(s): CESAREAN SECTION (N/A)  SURGEON:  Surgeon(s) and Role:    * Roselle LocusJames E Medea Deines II, MD - Primary  PHYSICIAN ASSISTANT:   ASSISTANTS: none   ANESTHESIA:   epidural  EBL:  Total I/O In: 1000 [I.V.:1000] Out: 875 [Urine:75; Blood:800]  BLOOD ADMINISTERED:none  DRAINS: Urinary Catheter (Foley)   LOCAL MEDICATIONS USED:  Amount: 20 cc in 20 cc of saline ml and OTHER Exparel  SPECIMEN:  No Specimen  DISPOSITION OF SPECIMEN:  N/A  COUNTS:  YES  TOURNIQUET:  * No tourniquets in log *  DICTATION: .Other Dictation: Dictation Number [id number]  PLAN OF CARE: Admit to inpatient   PATIENT DISPOSITION:  PACU - hemodynamically stable.   Delay start of Pharmacological VTE agent (>24hrs) due to surgical blood loss or risk of bleeding: not applicable

## 2013-04-16 NOTE — H&P (Signed)
Kaylee RifeVilay [name] is a 33 y.o. female presenting for IOL with favorable cervix. No ROM, no HA, no epigastric pain. Maternal Medical History:  Fetal activity: Perceived fetal activity is normal.      OB History   Grav Para Term Preterm Abortions TAB SAB Ect Mult Living   1              Past Medical History  Diagnosis Date  . Headache(784.0)     migraines  . Anemia    Past Surgical History  Procedure Laterality Date  . 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 Social History:  reports that she has never smoked. She has never used smokeless tobacco. She reports that she does not drink alcohol or use illicit drugs.   Prenatal Transfer Tool  Maternal Diabetes: No Genetic Screening: Normal Maternal Ultrasounds/Referrals: Normal Fetal Ultrasounds or other Referrals:  None Maternal Substance Abuse:  No Significant Maternal Medications:  None Significant Maternal Lab Results:  None Other Comments:  None  Review of Systems  Eyes: Negative for blurred vision.  Gastrointestinal: Negative for abdominal pain.  Neurological: Negative for headaches.      Blood pressure 130/80, pulse 89, temperature 98 F (36.7 C), temperature source Oral, resp. rate 18, height 5\' 5"  (1.651 m), weight 200 lb (90.719 kg), last menstrual period 07/08/2012.   Fetal Exam Fetal State Assessment: Category I - tracings are normal.     Physical Exam  Cardiovascular: Normal rate and regular rhythm.   Respiratory: Effort normal.  GI: Soft. There is no tenderness.  Neurological: She has normal reflexes.    Prenatal labs: ABO, Rh: B/Positive/-- (08/11 0000) Antibody: Negative (08/11 0000) Rubella: Immune (08/11 0000) RPR: Nonreactive (08/11 0000)  HBsAg: Negative (08/11 0000)  HIV: Non-reactive (08/11 0000)  GBS: Negative (02/19 0000)   Assessment/Plan: 33 yo  G1P0 at 40 2/7 weeks for IOL Unable to establish IV access-awaiting anesthesia to place IV Will then check cervix D/W patient and husband   Giovan Pinsky II,Kaylee [name] E 04/16/2013, 7:59 AM

## 2013-04-16 NOTE — Anesthesia Preprocedure Evaluation (Signed)
Anesthesia Evaluation  Patient identified by MRN, date of birth, ID band Patient awake    Reviewed: Allergy & Precautions, H&P , NPO status , Patient's Chart, lab work & pertinent test results  Airway Mallampati: II TM Distance: >3 FB Neck ROM: full    Dental no notable dental hx.    Pulmonary neg pulmonary ROS,    Pulmonary exam normal       Cardiovascular negative cardio ROS      Neuro/Psych negative psych ROS   GI/Hepatic negative GI ROS, Neg liver ROS,   Endo/Other  negative endocrine ROS  Renal/GU negative Renal ROS  negative genitourinary   Musculoskeletal negative musculoskeletal ROS (+)   Abdominal Normal abdominal exam  (+)   Peds  Hematology   Anesthesia Other Findings   Reproductive/Obstetrics (+) Pregnancy                           Anesthesia Physical Anesthesia Plan  ASA: II  Anesthesia Plan: Epidural   Post-op Pain Management:    Induction:   Airway Management Planned:   Additional Equipment:   Intra-op Plan:   Post-operative Plan:   Informed Consent: I have reviewed the patients History and Physical, chart, labs and discussed the procedure including the risks, benefits and alternatives for the proposed anesthesia with the patient or authorized representative who has indicated his/her understanding and acceptance.     Plan Discussed with:   Anesthesia Plan Comments:         Anesthesia Quick Evaluation

## 2013-04-17 ENCOUNTER — Encounter (HOSPITAL_COMMUNITY): Payer: Self-pay

## 2013-04-17 LAB — CBC
HCT: 25.7 % — ABNORMAL LOW (ref 36.0–46.0)
Hemoglobin: 8.3 g/dL — ABNORMAL LOW (ref 12.0–15.0)
MCH: 22 pg — AB (ref 26.0–34.0)
MCHC: 32.3 g/dL (ref 30.0–36.0)
MCV: 68.2 fL — ABNORMAL LOW (ref 78.0–100.0)
Platelets: 199 10*3/uL (ref 150–400)
RBC: 3.77 MIL/uL — ABNORMAL LOW (ref 3.87–5.11)
RDW: 15.5 % (ref 11.5–15.5)
WBC: 20.3 10*3/uL — ABNORMAL HIGH (ref 4.0–10.5)

## 2013-04-17 NOTE — Anesthesia Postprocedure Evaluation (Signed)
Anesthesia Post Note  Patient: Kaylee [name]  Procedure(s) Performed: Procedure(s) (LRB): CESAREAN SECTION (N/A)  Anesthesia type: Epidural  Patient location: PACU  Post pain: Pain level controlled  Post assessment: Post-op Vital signs reviewed  Last Vitals: BP 112/65  Pulse 93  Temp(Src) 37 C (Oral)  Resp 20  Ht 5\' 5"  (1.651 m)  Wt 200 lb (90.719 kg)  BMI 33.28 kg/m2  SpO2 97%  LMP 07/08/2012  Breastfeeding? Unknown  Post vital signs: Reviewed  Level of consciousness: sedated  Complications: No apparent anesthesia complications

## 2013-04-17 NOTE — Op Note (Signed)
Kaylee [name], DELANGE NO.:  [id number]  MEDICAL RECORD NO.:  [id number]  LOCATION:  9148                          FACILITY:  [facility]  PHYSICIAN:  Guy Sandifer. Henderson Cloud, M.D. DATE OF BIRTH:  [birth year]  DATE OF PROCEDURE:  04/16/2013 DATE OF DISCHARGE:                              OPERATIVE REPORT   PREOPERATIVE DIAGNOSIS:  Arrest of descent.  POSTOPERATIVE DIAGNOSIS:  Arrest of descent.  PROCEDURE:  Primary low transverse cesarean section.  SURGEON:  Guy Sandifer. [name], MD  ANESTHESIA:  Epidural.  ESTIMATED BLOOD LOSS:  550 mL.  FINDINGS:  Viable female infant, Apgars, arterial cord pH and weight pending at the time of dictation.  INDICATIONS AND CONSENT:  This patient is a 33 year old, G1, P0, at 95 and 2/7 th weeks, who was admitted for induction of labor with a favorable cervix.  She progresses to a rim with an LOT vertex.  IUPC was placed to assure adequate labor.  After observation for 2-1/2 to 3 hours, the cervix fails to progress and the vertex remains above 0 station.  Arrest of descent was diagnosed and cesarean section was recommended to the patient.  Potential risks and complications were reviewed with the patient including but not limited to, infection, organ damage, bleeding requiring transfusion of blood products with HIV and hepatitis acquisition, DVT, PE, pneumonia.  All questions were answered and consent was signed on the chart.  PROCEDURE:  The patient was taken to the operating room were epidural anesthetic was augmented to a surgical level.  She was prepped, Foley catheter placed and draped per [facility] protocol.  Time-out undertaken.  After testing for adequate epidural anesthesia, skin was entered through a Pfannenstiel incision and dissection carried out in layers to the peritoneum.  Peritoneum was incised, extended superiorly and inferiorly.  Vesicouterine peritoneum was taken down cephalolaterally.  The bladder flap was  developed and the bladder blade was placed.  Uterus was incised in a low transverse manner.  The uterine cavity was entered bluntly with a hemostat.  The vertex was then delivered from the LOP position and the oral and nasopharynx were suctioned.  The remainder of the baby was delivered without difficulty. Good cry and tone was noted.  Cord was clamped and cut and the baby was handed to awaiting pediatrics team.  Placenta was manually delivered. Uterine cavity was cleaned.  Uterus was closed in 2 running locking imbricating layers of 0 Monocryl suture which achieved good hemostasis. Irrigation was carried out.  Anterior peritoneum was closed in a running fashion with 0 Monocryl suture which was also used to reapproximate the pyramidalis muscle in the midline.  The anterior rectus fascia was then closed in running fashion with a 0 looped PDS suture.  A 20 mL of Exparel diluted in 20 mL of normal saline was then injected both subfascially and subcutaneously throughout the incision.  The subcutaneous layers were closed with interrupted plain sutures.  The skin was closed with subcuticular Vicryl on a Keith needle.  Steri- Strips were applied and dressings were applied.  All counts were correct.  The patient was taken to recovery room in stable condition.     Guy Sandifer Henderson Cloud, M.D.  JET/MEDQ  D:  04/16/2013  T:  04/17/2013  Job:  [id number]

## 2013-04-17 NOTE — Anesthesia Postprocedure Evaluation (Signed)
  Anesthesia Post-op Note  Patient: Kaylee [name]  Procedure(s) Performed: Procedure(s): CESAREAN SECTION (N/A)  Patient Location: Mother/Baby  Anesthesia Type:Epidural  Level of Consciousness: awake, alert  and oriented  Airway and Oxygen Therapy: Patient Spontanous Breathing  Post-op Pain: none  Post-op Assessment: Post-op Vital signs reviewed, Patient's Cardiovascular Status Stable, Respiratory Function Stable, No headache, No backache, No residual numbness and No residual motor weakness  Post-op Vital Signs: Reviewed and stable  Complications: No apparent anesthesia complications

## 2013-04-17 NOTE — Lactation Note (Signed)
This note was copied from the chart [facility]. Lactation Consultation Note Follow up consult:  Baby Kaylee 1018 hours old and cueing. Grandmother undressed baby. Taught mother hand expression, was able to express drops. Assisted in placing baby in football hold, sucks and swallows observed >10 min. Reviewed basics, cluster feeding, pacifier use, supply and demand. Encouraged mother to call if assistance is needed.  Patient Name: Kaylee Lewanda RifeVilay Bazinet ZOXWR'UToday's Date: 04/17/2013 Reason for consult: Follow-up assessment   Maternal Data Has patient been taught Hand Expression?: Yes Does the patient have breastfeeding experience prior to this delivery?: No  Feeding Feeding Type: Breast Fed Length of feed: 60 min  LATCH Score/Interventions Latch: Grasps breast easily, tongue down, lips flanged, rhythmical sucking.  Audible Swallowing: A few with stimulation Intervention(s): Skin to skin Intervention(s): Skin to skin  Type of Nipple: Everted at rest and after stimulation  Comfort (Breast/Nipple): Soft / non-tender     Hold (Positioning): Assistance needed to correctly position infant at breast and maintain latch.  LATCH Score: 8  Lactation Tools Discussed/Used     Consult Status Consult Status: Follow-up Date: 04/18/13 Follow-up type: In-patient    Dahlia ByesBerkelhammer, [facility] 04/17/2013, 2:13 PM

## 2013-04-17 NOTE — Progress Notes (Cosign Needed)
Subjective: Postpartum Day 1: Cesarean Delivery Patient reports tolerating PO and + flatus.    Objective: Vital signs in last 24 hours: Temp:  [97.8 F (36.6 C)-99.7 F (37.6 C)] 98.3 F (36.8 C) (03/19 0610) Pulse Rate:  [59-108] 103 (03/19 0610) Resp:  [18-23] 20 (03/19 0610) BP: (92-145)/(47-93) 101/62 mmHg (03/19 0610) SpO2:  [85 %-100 %] 94 % (03/19 0610)  Physical Exam:  General: alert and cooperative Lochia: appropriate Uterine Fundus: firm Incision: honeycomb dressing recently replaced, no active bleeding noted DVT Evaluation: No evidence of DVT seen on physical exam. Negative Homan's sign. No cords or calf tenderness. Calf/Ankle edema is present. Foley removed, has not voided at the time of rounding  Recent Labs  04/16/13 0840 04/17/13 0658  HGB 10.8* 8.3*  HCT 33.4* 25.7*    Assessment/Plan: Status post Cesarean section. Doing well postoperatively.  Continue current care. Cbc in am Usman Millett G 04/17/2013, 8:36 AM

## 2013-04-17 NOTE — Addendum Note (Signed)
Addendum created 04/17/13 0810 by Shanon PayorSuzanne M Khalia Gong, CRNA   Modules edited: Notes Section   Notes Section:  File: [id number]

## 2013-04-17 NOTE — Lactation Note (Signed)
This note was copied from the [facility]. Lactation Consultation Note Initial consult:  Mother getting ready to take nap, baby sleeping in visitors arms. Mother understands feeding cues. Reviewed lactation support services and brochure. Left LC number and encouraged mother to call with next feeding. Patient Name: Kaylee Lewanda RifeVilay [name] UJWJX'BToday's Date: 04/17/2013 Reason for consult: Initial assessment   Maternal Data Does the patient have breastfeeding experience prior to this delivery?: No  Feeding Feeding Type: Breast Fed Length of feed: 60 min  LATCH Score/Interventions Latch: Grasps breast easily, tongue down, lips flanged, rhythmical sucking.  Audible Swallowing: A few with stimulation Intervention(s): Skin to skin Intervention(s): Skin to skin  Type of Nipple: Everted at rest and after stimulation  Comfort (Breast/Nipple): Soft / non-tender     Hold (Positioning): Assistance needed to correctly position infant at breast and maintain latch.  LATCH Score: 8  Lactation Tools Discussed/Used     Consult Status Consult Status: Follow-up Date: 04/18/13 Follow-up type: In-patient    Dahlia ByesBerkelhammer, Ruth [facility] 04/17/2013, 1:28 PM

## 2013-04-18 LAB — CBC
HCT: 25 % — ABNORMAL LOW (ref 36.0–46.0)
Hemoglobin: 8 g/dL — ABNORMAL LOW (ref 12.0–15.0)
MCH: 21.9 pg — AB (ref 26.0–34.0)
MCHC: 32 g/dL (ref 30.0–36.0)
MCV: 68.3 fL — ABNORMAL LOW (ref 78.0–100.0)
PLATELETS: 218 10*3/uL (ref 150–400)
RBC: 3.66 MIL/uL — ABNORMAL LOW (ref 3.87–5.11)
RDW: 15.8 % — ABNORMAL HIGH (ref 11.5–15.5)
WBC: 18.6 10*3/uL — ABNORMAL HIGH (ref 4.0–10.5)

## 2013-04-18 LAB — BIRTH TISSUE RECOVERY COLLECTION (PLACENTA DONATION)

## 2013-04-18 MED ORDER — IBUPROFEN 600 MG PO TABS: 600.0000 mg | ORAL_TABLET | Freq: Four times a day (QID) | ORAL | Status: DC

## 2013-04-18 MED ORDER — OXYCODONE-ACETAMINOPHEN 5-325 MG PO TABS: 1.0000 | ORAL_TABLET | ORAL | Status: DC | PRN

## 2013-04-18 NOTE — Progress Notes (Signed)
Subjective: Postpartum Day 2: Cesarean Delivery Patient reports tolerating PO, + flatus and no problems voiding.    Objective: Vital signs in last 24 hours: Temp:  [97.5 F (36.4 C)-98.4 F (36.9 C)] 97.5 F (36.4 C) (03/20 0605) Pulse Rate:  [79-94] 79 (03/20 0605) Resp:  [17-20] 18 (03/20 0605) BP: (91-111)/(60-65) 91/63 mmHg (03/20 0605) SpO2:  [96 %-98 %] 98 % (03/19 1745)  Physical Exam:  General: alert, cooperative and appears stated age Lochia: appropriate Uterine Fundus: firm Incision: healing well, no significant drainage, no dehiscence, no significant erythema DVT Evaluation: No evidence of DVT seen on physical exam.   Recent Labs  04/17/13 0658 04/18/13 0646  HGB 8.3* 8.0*  HCT 25.7* 25.0*    Assessment/Plan: Status post Cesarean section. Doing well postoperatively.  Continue current care.  Kaylee [name] L 04/18/2013, 8:13 AM

## 2013-04-18 NOTE — Discharge Summary (Signed)
Obstetric Discharge Summary Reason for Admission: induction of labor Prenatal Procedures: none Intrapartum Procedures: cesarean: low cervical, transverse Postpartum Procedures: none Complications-Operative and Postpartum: none HGB  Date Value Ref Range Status  10/28/2012 10.2* 11.6 - 15.9 g/dL Final     Hemoglobin  Date Value Ref Range Status  04/18/2013 8.0* 12.0 - 15.0 g/dL Final     HCT  Date Value Ref Range Status  04/18/2013 25.0* 36.0 - 46.0 % Final  10/28/2012 31.0* 34.8 - 46.6 % Final    Physical Exam:  General: alert Lochia: appropriate Uterine Fundus: firm Incision: healing well, no significant drainage, no dehiscence, no significant erythema DVT Evaluation: No evidence of DVT seen on physical exam.  Discharge Diagnoses: Term Pregnancy-delivered  Discharge Information: Date: 04/18/2013 Activity: pelvic rest Diet: routine Medications: Ibuprofen and Percocet Condition: stable Instructions: refer to practice specific booklet Discharge to: home   Newborn Data: Live born female  Birth Weight: 9 lb 11.2 oz (4400 g) APGAR: 8, 9  Home with mother.  Kymberlie Brazeau L 04/18/2013, 8:13 AM

## 2013-12-01 ENCOUNTER — Encounter (HOSPITAL_COMMUNITY): Payer: Self-pay

## 2014-01-07 NOTE — OR Nursing (Incomplete)
Case start 1907

## 2015-07-08 ENCOUNTER — Other Ambulatory Visit: Payer: Self-pay | Admitting: Obstetrics and Gynecology

## 2015-07-08 ENCOUNTER — Encounter: Payer: Self-pay | Admitting: Gastroenterology

## 2015-07-08 DIAGNOSIS — R1013 Epigastric pain: Secondary | ICD-10-CM

## 2015-07-16 ENCOUNTER — Ambulatory Visit
Admission: RE | Admit: 2015-07-16 | Discharge: 2015-07-16 | Disposition: A | Discharge status: Home / Self Care | Payer: Managed Care, Other (non HMO) | Source: Ambulatory Visit | Attending: Obstetrics and Gynecology | Admitting: Obstetrics and Gynecology

## 2015-07-16 DIAGNOSIS — R1013 Epigastric pain: Secondary | ICD-10-CM

## 2015-09-07 ENCOUNTER — Ambulatory Visit: Payer: Self-pay | Admitting: Gastroenterology

## 2015-11-10 ENCOUNTER — Ambulatory Visit (INDEPENDENT_AMBULATORY_CARE_PROVIDER_SITE_OTHER): Payer: Managed Care, Other (non HMO) | Admitting: Gastroenterology

## 2015-11-10 ENCOUNTER — Encounter: Payer: Self-pay | Admitting: Gastroenterology

## 2015-11-10 ENCOUNTER — Encounter (INDEPENDENT_AMBULATORY_CARE_PROVIDER_SITE_OTHER): Payer: Self-pay

## 2015-11-10 VITALS — BP 120/84 | HR 72 | Ht 65.0 in | Wt 158.6 lb

## 2015-11-10 DIAGNOSIS — R1013 Epigastric pain: Secondary | ICD-10-CM

## 2015-11-10 DIAGNOSIS — D649 Anemia, unspecified: Secondary | ICD-10-CM | POA: Diagnosis not present

## 2015-11-10 NOTE — Progress Notes (Signed)
HPI :  35 y/o female with a PMH of anemia and migraine headaches, here for a new patient evaluation of epigastric pain.   Symptoms occurred in June, lasted 2 weeks. She had upper abdominal cramps with decreased appetite. She had changed her diet at the time trying to lose weight, she thinks may have been related at the time. Had US showing GB sludge without stones. LFTs were normal.  Pain was in the epigastric area. She reported it was intertmittent but long lasting. Did not radiate. Rated roughly 6-7/10. She did not think eating made it worse, but she did have a poor appetite. No nausea. No vomiting. She denied any NSAIDs.  She has used some tagamet at the time which she thinks helped her symptoms. No reflux symptoms at the time. After 2 weeks her symptoms went away when she reported resuming her regular diet. First time this has occurred. She denies any FH of malignancy. She denies any prior endoscopy. Sister has had "gastritis", father has had ulcers. Family is from [place].   She reports a history of anemia with reports of iron deficiency. She denies heavy menses. She has been on iron in the past but does not take it routinely. NO blood in the stools, no trouble with the bowels. She reports longstanding history of anemia since she was a child. She reports having a prior workup for thalassemia which she states was negative. Father has thallasemia.  Labs per primary care 07/09/15 CBC - WBC 5.9, Hgb 11.4, MCV 68.7 LFTs normal  [place] 07/16/15 - shows sludge in GB, no stones, no thickening, normal CBD     Past Medical History:  Diagnosis Date  . Anemia   . Headache(784.0)    migraines     Past Surgical History:  Procedure Laterality Date  . CESAREAN SECTION N/A 04/16/2013   Procedure: CESAREAN SECTION;  Surgeon: Leslie Andrea, MD;  Location: [facility];  Service: Obstetrics;  Laterality: N/A;  . TUMOR REMOVAL  2006   benign from neck   Family History  Problem Relation Age of Onset  .  Hypertension Mother   . Thalassemia Father   . Hemochromatosis Father   . Hypertension Sister    Social History  Substance Use Topics  . Smoking status: Never Smoker  . Smokeless tobacco: Never Used  . Alcohol use No   Current Outpatient Prescriptions  Medication Sig Dispense Refill  . acetaminophen (TYLENOL) 325 MG tablet Take 650 mg by mouth every 6 (six) hours as needed.     No current facility-administered medications for this visit.    No Known Allergies   Review of Systems: All systems reviewed and negative except where noted in HPI.   As outlined above  Physical Exam: BP 120/84 (BP Location: Left Arm, Patient Position: Sitting, Cuff Size: Normal)   Pulse 72   Ht 5\' 5"  (1.651 m)   Wt 158 lb 9.6 oz (71.9 kg)   LMP 11/10/2015 (Exact Date)   BMI 26.39 kg/m  Constitutional: Pleasant,well-developed, female in no acute distress. HEENT: Normocephalic and atraumatic. Conjunctivae are normal. No scleral icterus. Neck supple.  Cardiovascular: Normal rate, regular rhythm.  Pulmonary/chest: Effort normal and breath sounds normal. No wheezing, rales or rhonchi. Abdominal: Soft, nondistended, nontender. There are no masses palpable. No hepatomegaly. Extremities: no edema Lymphadenopathy: No cervical adenopathy noted. Neurological: Alert and oriented to person place and time. Skin: Skin is warm and dry. No rashes noted. Psychiatric: Normal mood and affect. Behavior is normal.  ASSESSMENT AND PLAN: 35 year old female is treated as outlined above presenting for evaluation of epigastric pain, described as above. Unclear if her symptoms were related to change in diet at the time, or perhaps gastritis or reflux, she appears to have responded to Tagamet. Reassured her that her symptoms are unlikely to be biliary colic or due to gallbladder. Currently asymptomatic without any symptoms at all. She does have a mild microcytic anemia noted on labs, which she states is a chronic issue for  her. She thinks this is due to iron deficiency and has had a negative evaluation for thalassemia per her report (no records of this available today). She denies any GI symptoms concerning for iron deficiency, which may otherwise be due to normal menses. I offered her testing for her iron studies today to see if she warrants repletion and consideration for further evaluation, but she declined and preferred to see her primary care for this moving forward. If she has symptoms of her pain in the future, I would recommend she contact me for reassessment, would obtain H. pylori stool testing and trial of antacid.  Ileene PatrickSteven Armbruster, MD [facility] Pager [phone]  CC: Nira Retortomlinson, James, PA-C

## 2015-11-10 NOTE — Patient Instructions (Signed)
If you are age 33 or older, your body mass index should be between 23-30. Your Body mass index is 26.39 kg/m. If this is out of the aforementioned range listed, please consider follow up with your Primary Care Provider.  If you are age 33 or younger, your body mass index should be between 19-25. Your Body mass index is 26.39 kg/m. If this is out of the aformentioned range listed, please consider follow up with your Primary Care Provider.   Thank you for choosing [facility].

## 2017-02-21 IMAGING — US US ABDOMEN COMPLETE
1 series · 14 of 25 positions shown · non-contrast
Comparison: None.

CLINICAL DATA: 34-year-old female with epigastric pain for 2 weeks.
Initial encounter.

EXAM:
ABDOMEN ULTRASOUND COMPLETE

[Series 1: us abdomen complete · 0.21mm/px · 14 of 81 slices shown]
[im 1/81]
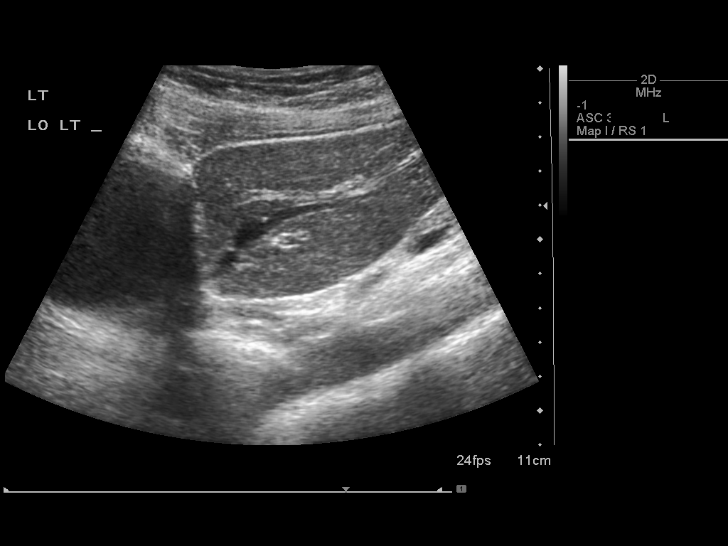
[im 7/81]
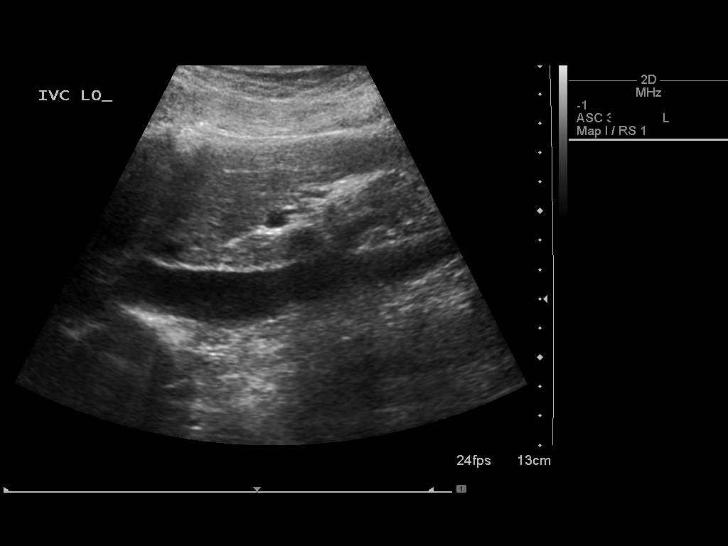
[im 14/81]
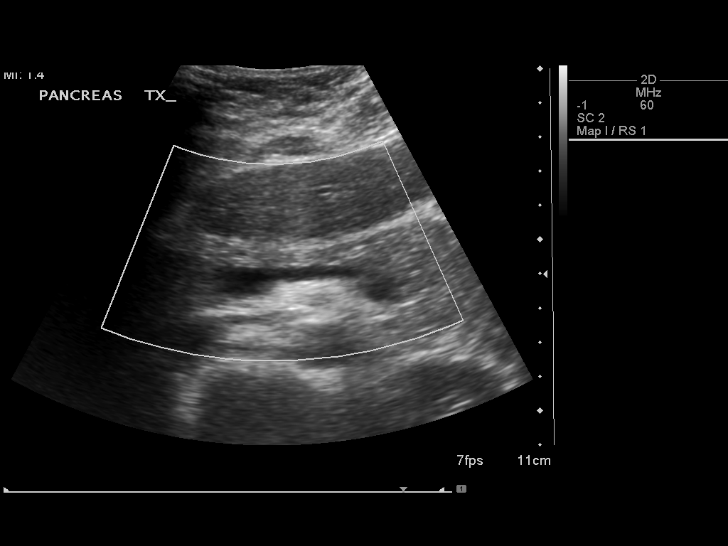
[im 21/81]
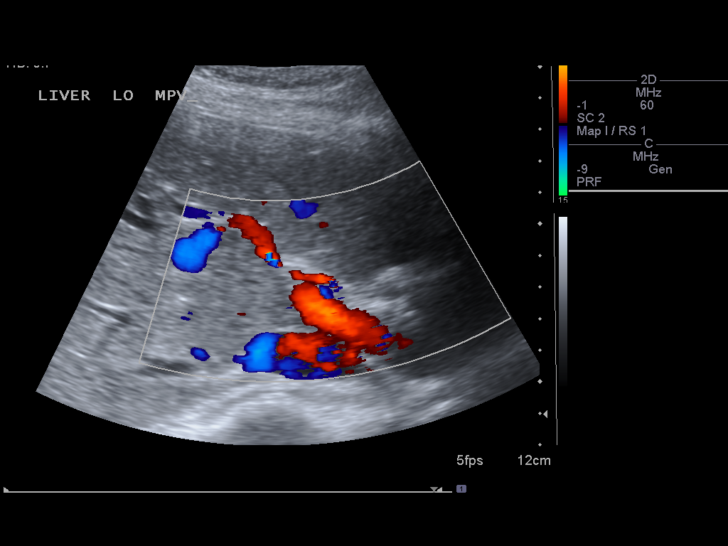
[im 27/81]
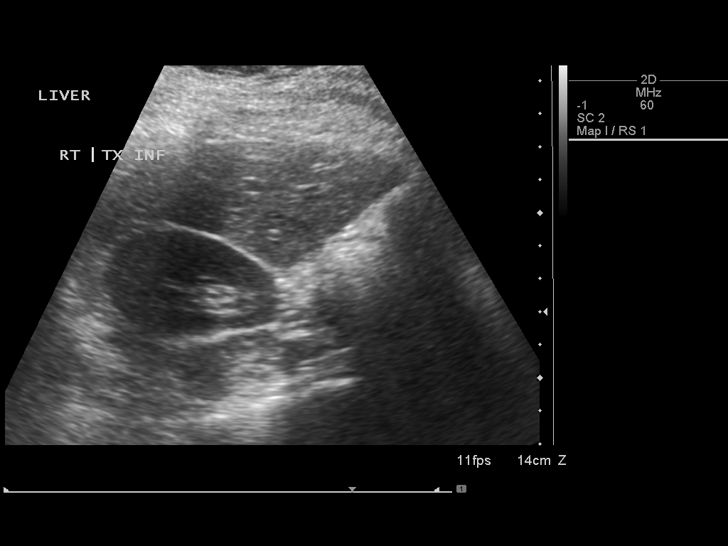
[im 31/81]
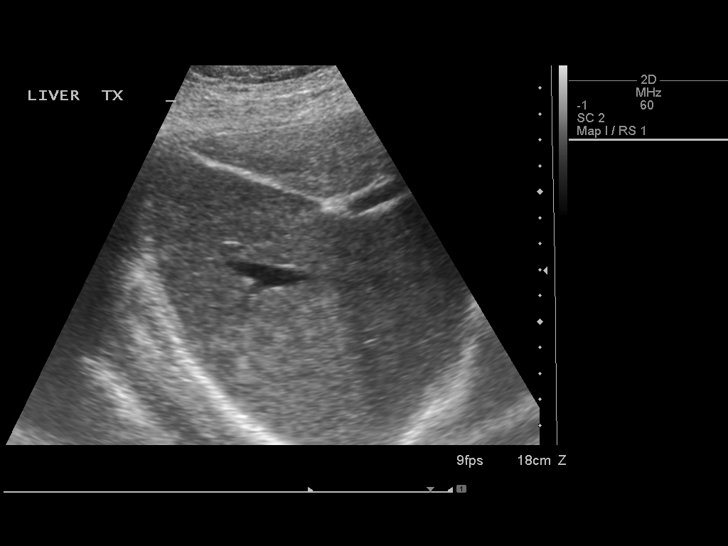
[im 37/81]
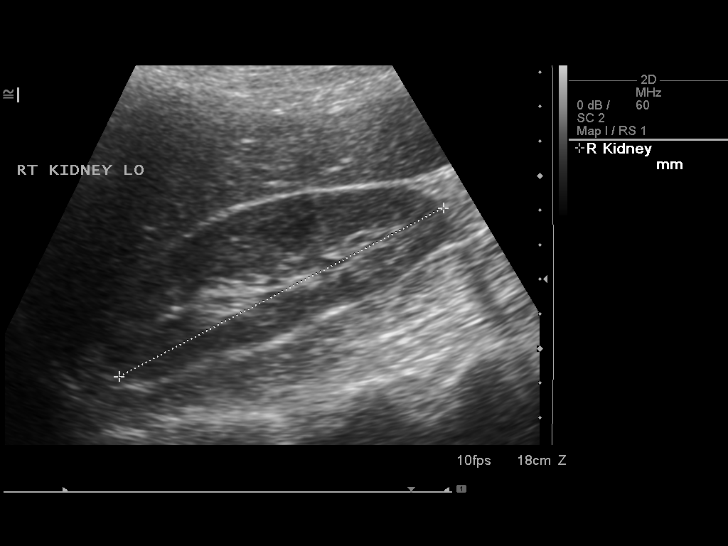
[im 44/81]
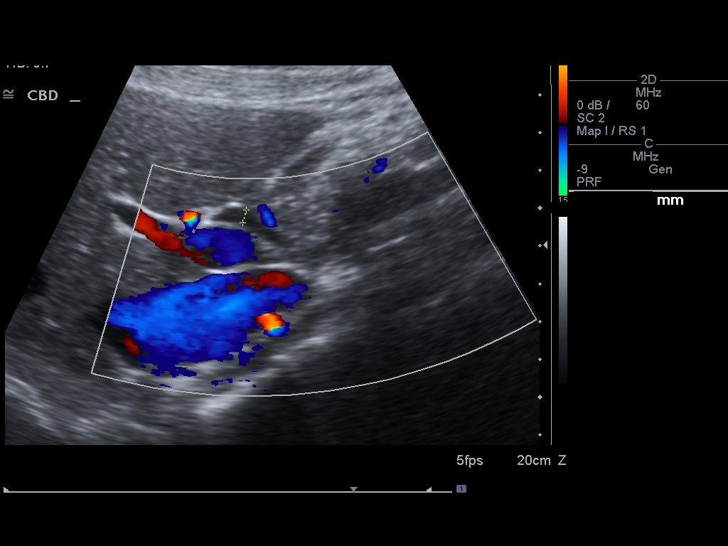
[im 51/81]
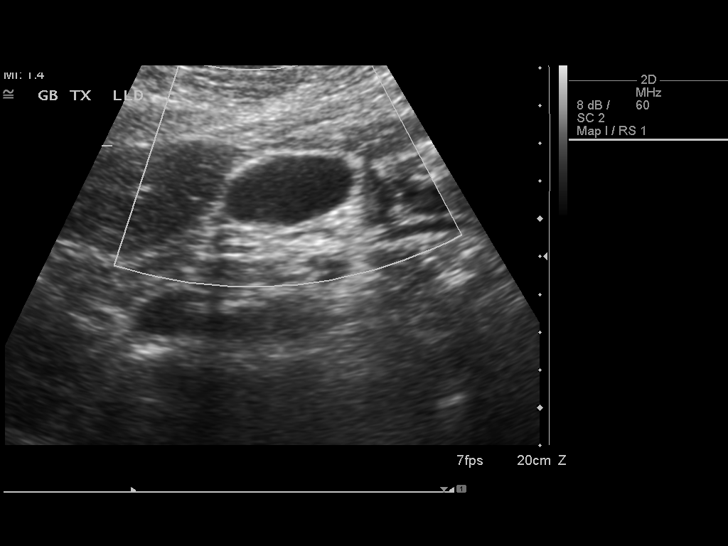
[im 54/81]
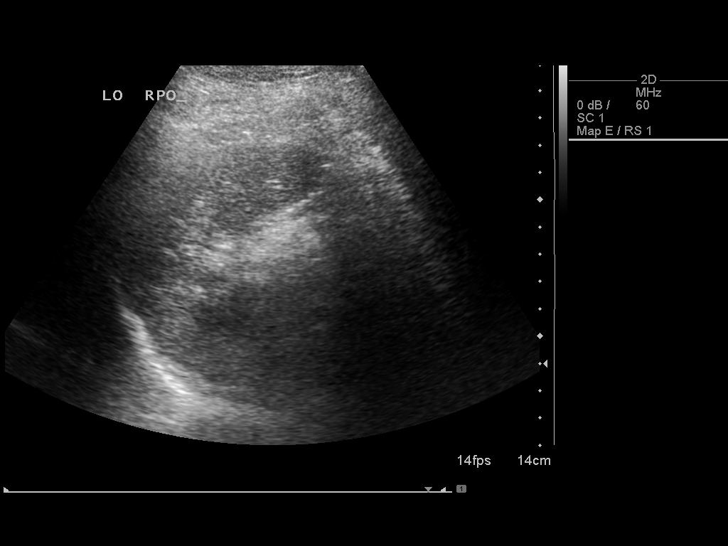
[im 61/81]
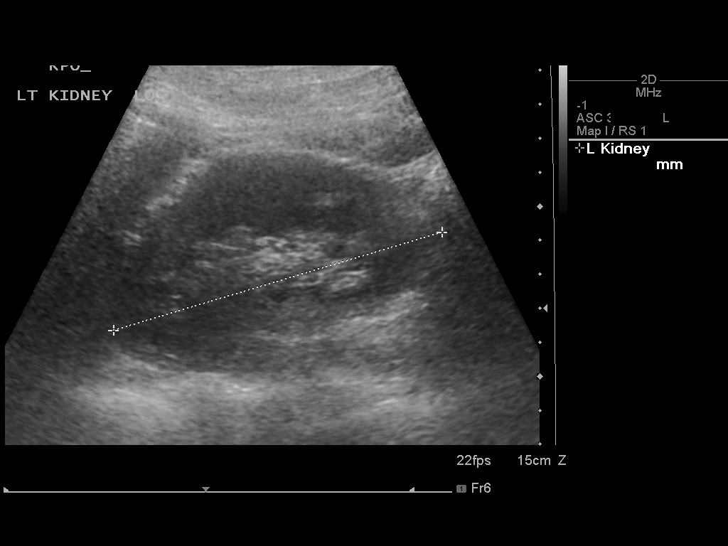
[im 67/81]
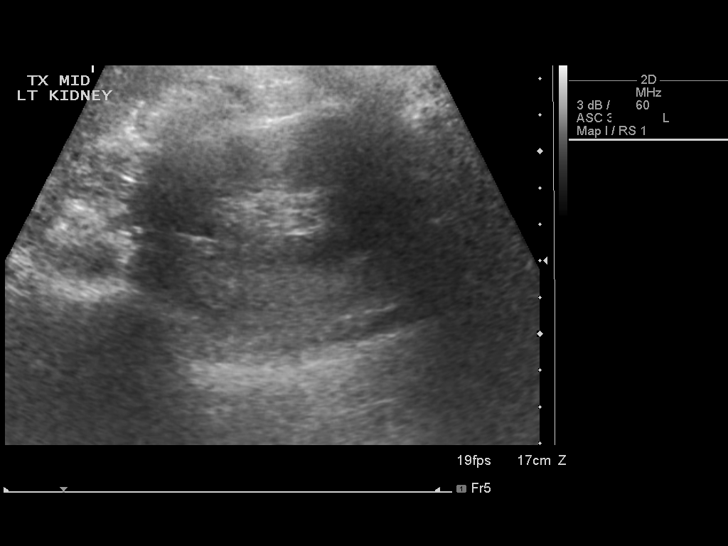
[im 74/81]
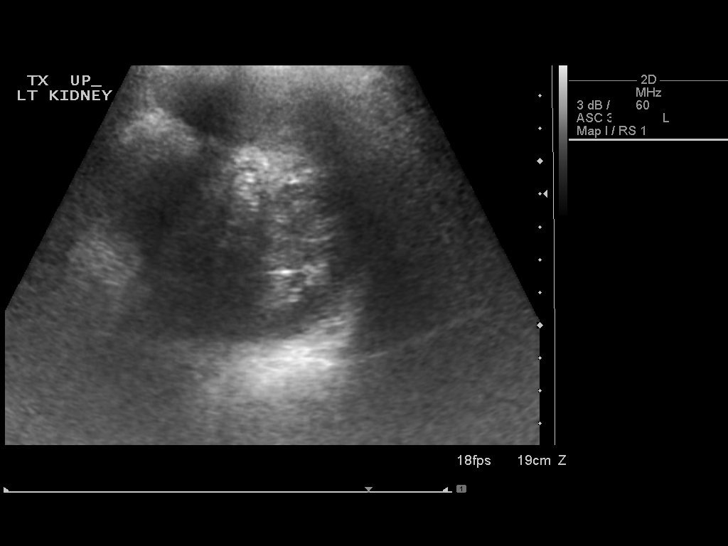
[im 81/81]
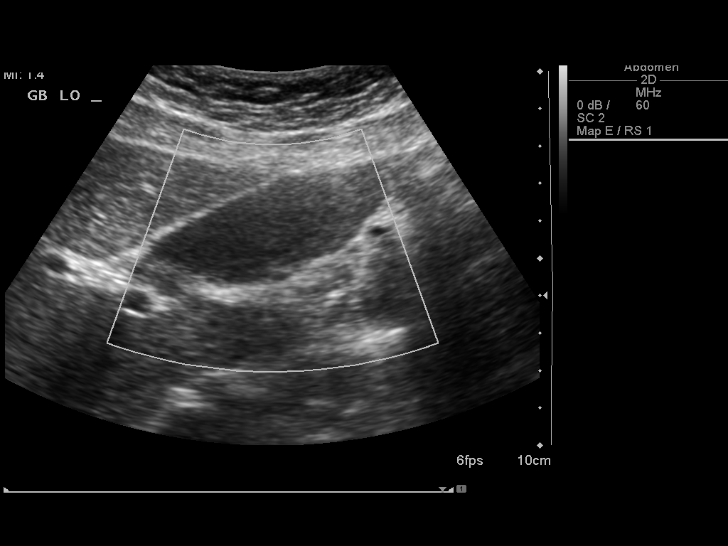

[14 of 25 positions shown; findings below may reference images not displayed]

FINDINGS: Gallbladder: Layering sludge within the gallbladder (image 79). No
gallstones identified. Gallbladder wall thickness remains normal. No
pericholecystic fluid.

Common bile duct: Diameter: 4 mm, normal

Liver: No focal lesion identified. Within normal limits in
parenchymal echogenicity.

IVC: No abnormality visualized.

Pancreas: Visualized portion unremarkable.

Spleen: Size and appearance within normal limits.

Right Kidney: Length: 10.6 cm. Echogenicity within normal limits. No
mass or hydronephrosis visualized.

Left Kidney: Length: 10.1 cm. Echogenicity within normal limits. No
mass or hydronephrosis visualized.

Abdominal aorta: No aneurysm visualized.

Other findings: None.
IMPRESSION: 1. Gallbladder sludge, but no gallstones identified and no evidence
of acute cholecystitis.
2. Otherwise negative abdomen ultrasound.

## 2018-01-30 NOTE — L&D Delivery Note (Addendum)
Delivery Note/VBAC  SVD viable female Apgars 2nd degree ML lac  Placenta delivered spontaneously intact with 3VC. Repair with 2-0 Chromic with good support and hemostasis noted.  R/V exam confirms.  PH art was sent.   Mother and baby to couplet care and are doing well.  EBL 150cc  Louretta Shorten, MD

## 2018-04-24 LAB — OB RESULTS CONSOLE GC/CHLAMYDIA
Chlamydia: NEGATIVE
Gonorrhea: NEGATIVE

## 2018-04-24 LAB — OB RESULTS CONSOLE RPR: RPR: NONREACTIVE

## 2018-04-24 LAB — OB RESULTS CONSOLE ANTIBODY SCREEN: Antibody Screen: NEGATIVE

## 2018-04-24 LAB — OB RESULTS CONSOLE ABO/RH: RH Type: POSITIVE

## 2018-04-24 LAB — OB RESULTS CONSOLE HEPATITIS B SURFACE ANTIGEN: Hepatitis B Surface Ag: NEGATIVE

## 2018-04-24 LAB — OB RESULTS CONSOLE HIV ANTIBODY (ROUTINE TESTING): HIV: NONREACTIVE

## 2018-10-23 LAB — OB RESULTS CONSOLE GBS: GBS: NEGATIVE

## 2018-11-06 ENCOUNTER — Telehealth (HOSPITAL_COMMUNITY): Payer: Self-pay | Admitting: *Deleted

## 2018-11-06 ENCOUNTER — Encounter (HOSPITAL_COMMUNITY): Payer: Self-pay | Admitting: *Deleted

## 2018-11-06 NOTE — Telephone Encounter (Signed)
Preadmission screen  

## 2018-11-06 NOTE — Patient Instructions (Addendum)
Kaylee [name]  11/06/2018   Your procedure is scheduled on:  11/20/2018  Arrive at 15 at Entrance C on [address] at [facility]  and [employer]. You are invited to use the FREE valet parking or use the Visitor's parking deck.  Pick up the phone at the desk and dial [phone].  Call this number if you have problems the morning of surgery: [phone]  Remember:   Do not eat food:(After Midnight) Desps de medianoche.  Do not drink clear liquids: (After Midnight) Desps de medianoche.  Take these medicines the morning of surgery with A SIP OF WATER:  valtrex   Do not wear jewelry, make-up or nail polish.  Do not wear lotions, powders, or perfumes. Do not wear deodorant.  Do not shave 48 hours prior to surgery.  Do not bring valuables to the hospital.  [facility] is not   responsible for any belongings or valuables brought to the hospital.  Contacts, dentures or bridgework may not be worn into surgery.  Leave suitcase in the car. After surgery it may be brought to your room.  For patients admitted to the hospital, checkout time is 11:00 AM the day of              discharge.      Please read over the following fact sheets that you were given:     Preparing for Surgery

## 2018-11-07 ENCOUNTER — Encounter (HOSPITAL_COMMUNITY): Payer: Self-pay

## 2018-11-17 ENCOUNTER — Inpatient Hospital Stay (HOSPITAL_COMMUNITY): Payer: BC Managed Care – PPO | Admitting: Anesthesiology

## 2018-11-17 ENCOUNTER — Inpatient Hospital Stay (HOSPITAL_COMMUNITY)
Admission: AD | Admit: 2018-11-17 | Discharge: 2018-11-19 | DRG: 807 | Disposition: A | Discharge status: Home / Self Care | Payer: BC Managed Care – PPO | Attending: Obstetrics and Gynecology | Admitting: Obstetrics and Gynecology

## 2018-11-17 ENCOUNTER — Other Ambulatory Visit: Payer: Self-pay

## 2018-11-17 ENCOUNTER — Encounter (HOSPITAL_COMMUNITY): Payer: Self-pay | Admitting: *Deleted

## 2018-11-17 ENCOUNTER — Encounter (HOSPITAL_COMMUNITY): Payer: Self-pay | Admitting: Anesthesiology

## 2018-11-17 DIAGNOSIS — Z3A38 38 weeks gestation of pregnancy: Secondary | ICD-10-CM | POA: Diagnosis not present

## 2018-11-17 DIAGNOSIS — O134 Gestational [pregnancy-induced] hypertension without significant proteinuria, complicating childbirth: Secondary | ICD-10-CM | POA: Diagnosis present

## 2018-11-17 DIAGNOSIS — O34219 Maternal care for unspecified type scar from previous cesarean delivery: Secondary | ICD-10-CM | POA: Diagnosis present

## 2018-11-17 DIAGNOSIS — Z20828 Contact with and (suspected) exposure to other viral communicable diseases: Secondary | ICD-10-CM | POA: Diagnosis present

## 2018-11-17 LAB — COMPREHENSIVE METABOLIC PANEL
ALT: 14 U/L (ref 0–44)
AST: 21 U/L (ref 15–41)
Albumin: 3 g/dL — ABNORMAL LOW (ref 3.5–5.0)
Alkaline Phosphatase: 74 U/L (ref 38–126)
Anion gap: 12 (ref 5–15)
BUN: 12 mg/dL (ref 6–20)
CO2: 19 mmol/L — ABNORMAL LOW (ref 22–32)
Calcium: 9.3 mg/dL (ref 8.9–10.3)
Chloride: 106 mmol/L (ref 98–111)
Creatinine, Ser: 1.08 mg/dL — ABNORMAL HIGH (ref 0.44–1.00)
GFR calc Af Amer: >60 mL/min (ref >60)
GFR calc non Af Amer: >60 mL/min (ref >60)
Glucose, Bld: 91 mg/dL (ref 70–99)
Potassium: 3.8 mmol/L (ref 3.5–5.1)
Sodium: 137 mmol/L (ref 135–145)
Total Bilirubin: 0.3 mg/dL (ref 0.3–1.2)
Total Protein: 6.6 g/dL (ref 6.5–8.1)

## 2018-11-17 LAB — POCT FERN TEST: POCT Fern Test: POSITIVE

## 2018-11-17 LAB — CBC
HCT: 31.6 % — ABNORMAL LOW (ref 36.0–46.0)
Hemoglobin: 10.2 g/dL — ABNORMAL LOW (ref 12.0–15.0)
MCH: 22.2 pg — ABNORMAL LOW (ref 26.0–34.0)
MCHC: 32.3 g/dL (ref 30.0–36.0)
MCV: 68.8 fL — ABNORMAL LOW (ref 80.0–100.0)
Platelets: 263 10*3/uL (ref 150–400)
RBC: 4.59 MIL/uL (ref 3.87–5.11)
RDW: 15 % (ref 11.5–15.5)
WBC: 11.7 10*3/uL — ABNORMAL HIGH (ref 4.0–10.5)
nRBC: 0.2 % (ref 0.0–0.2)

## 2018-11-17 LAB — TYPE AND SCREEN
ABO/RH(D): B POS
Antibody Screen: NEGATIVE

## 2018-11-17 LAB — PROTEIN / CREATININE RATIO, URINE
Creatinine, Urine: 57.48 mg/dL
Protein Creatinine Ratio: 0.3 mg/mg{Cre} — ABNORMAL HIGH (ref 0.00–0.15)
Total Protein, Urine: 17 mg/dL

## 2018-11-17 LAB — SARS CORONAVIRUS 2 BY RT PCR (HOSPITAL ORDER, PERFORMED IN ~~LOC~~ HOSPITAL LAB): SARS Coronavirus 2: NEGATIVE

## 2018-11-17 MED ORDER — OXYTOCIN 40 UNITS IN NORMAL SALINE INFUSION - SIMPLE MED
2.5000 [IU]/h | INTRAVENOUS | Status: DC
  Filled 2018-11-17: qty 1000

## 2018-11-17 MED ORDER — FLEET ENEMA 7-19 GM/118ML RE ENEM: 1.0000 | ENEMA | RECTAL | Status: DC | PRN

## 2018-11-17 MED ORDER — EPHEDRINE 5 MG/ML INJ: 10.0000 mg | INTRAVENOUS | Status: DC | PRN

## 2018-11-17 MED ORDER — LIDOCAINE HCL (PF) 1 % IJ SOLN: 30.0000 mL | INTRAMUSCULAR | Status: DC | PRN

## 2018-11-17 MED ORDER — LACTATED RINGERS IV SOLN: 500.0000 mL | INTRAVENOUS | Status: DC | PRN

## 2018-11-17 MED ORDER — ONDANSETRON HCL 4 MG/2ML IJ SOLN: 4.0000 mg | Freq: Four times a day (QID) | INTRAMUSCULAR | Status: DC | PRN

## 2018-11-17 MED ORDER — PHENYLEPHRINE 40 MCG/ML (10ML) SYRINGE FOR IV PUSH (FOR BLOOD PRESSURE SUPPORT): 80.0000 ug | PREFILLED_SYRINGE | INTRAVENOUS | Status: DC | PRN

## 2018-11-17 MED ORDER — LACTATED RINGERS IV SOLN: 500.0000 mL | Freq: Once | INTRAVENOUS | Status: DC

## 2018-11-17 MED ORDER — DIPHENHYDRAMINE HCL 50 MG/ML IJ SOLN: 12.5000 mg | INTRAMUSCULAR | Status: DC | PRN

## 2018-11-17 MED ORDER — OXYTOCIN BOLUS FROM INFUSION: 500.0000 mL | Freq: Once | INTRAVENOUS | Status: DC

## 2018-11-17 MED ORDER — LIDOCAINE HCL (PF) 1 % IJ SOLN
INTRAMUSCULAR | Status: DC | PRN
  Administered 2018-11-17 (×2): 4 mL via EPIDURAL

## 2018-11-17 MED ORDER — LACTATED RINGERS IV SOLN
INTRAVENOUS | Status: DC
  Administered 2018-11-18: 01:00:00 via INTRAVENOUS

## 2018-11-17 MED ORDER — SOD CITRATE-CITRIC ACID 500-334 MG/5ML PO SOLN: 30.0000 mL | ORAL | Status: DC | PRN

## 2018-11-17 MED ORDER — OXYCODONE-ACETAMINOPHEN 5-325 MG PO TABS: 2.0000 | ORAL_TABLET | ORAL | Status: DC | PRN

## 2018-11-17 MED ORDER — FENTANYL-BUPIVACAINE-NACL 0.5-0.125-0.9 MG/250ML-% EP SOLN
EPIDURAL | Status: AC
  Filled 2018-11-17: qty 250

## 2018-11-17 MED ORDER — OXYCODONE-ACETAMINOPHEN 5-325 MG PO TABS: 1.0000 | ORAL_TABLET | ORAL | Status: DC | PRN

## 2018-11-17 MED ORDER — ACETAMINOPHEN 325 MG PO TABS: 650.0000 mg | ORAL_TABLET | ORAL | Status: DC | PRN

## 2018-11-17 MED ORDER — PHENYLEPHRINE 40 MCG/ML (10ML) SYRINGE FOR IV PUSH (FOR BLOOD PRESSURE SUPPORT)
PREFILLED_SYRINGE | INTRAVENOUS | Status: AC
  Filled 2018-11-17: qty 10

## 2018-11-17 MED ORDER — FENTANYL-BUPIVACAINE-NACL 0.5-0.125-0.9 MG/250ML-% EP SOLN: 12.0000 mL/h | EPIDURAL | Status: DC | PRN

## 2018-11-17 MED ORDER — SODIUM CHLORIDE (PF) 0.9 % IJ SOLN
INTRAMUSCULAR | Status: DC | PRN
  Administered 2018-11-17: 12 mL/h via EPIDURAL

## 2018-11-17 NOTE — MAU Provider Note (Signed)
First Provider Initiated Contact with Patient 11/17/18 2043      Kaylee [name] is a 38 y.o. G2P1001 female at [redacted]w[redacted]d who presents to MAU w/ gross leaking of fluid and incidental finding of elevated BP'Kaylee. Denies UC'Kaylee, HA, vision changes or epigastric pain.   O Constitutional: NAD GU: Pos fern. Vtx by informal BS [place].  VE Deferred   A GHTN SROM  P Transport to L&D  Pre-E labs Dr. Corinna Capra assuming care of pt.  Tamala Julian, [place], [place] 11/17/2018 10:02 PM

## 2018-11-17 NOTE — Anesthesia Preprocedure Evaluation (Signed)
Anesthesia Evaluation  Patient identified by MRN, date of birth, ID band Patient awake    Reviewed: Allergy & Precautions, H&P , NPO status , Patient's Chart, lab work & pertinent test results  Airway Mallampati: II  TM Distance: >3 FB Neck ROM: full    Dental  (+) Dental Advisory Given   Pulmonary neg pulmonary ROS,    Pulmonary exam normal        Cardiovascular negative cardio ROS Normal cardiovascular exam     Neuro/Psych negative psych ROS   GI/Hepatic negative GI ROS, Neg liver ROS,   Endo/Other  negative endocrine ROS  Renal/GU negative Renal ROS  negative genitourinary   Musculoskeletal negative musculoskeletal ROS (+)   Abdominal Normal abdominal exam  (+)   Peds  Hematology   Anesthesia Other Findings   Reproductive/Obstetrics (+) Pregnancy                             Anesthesia Physical  Anesthesia Plan  ASA: II  Anesthesia Plan: Epidural   Post-op Pain Management:    Induction:   PONV Risk Score and Plan:   Airway Management Planned:   Additional Equipment:   Intra-op Plan:   Post-operative Plan:   Informed Consent: I have reviewed the patients History and Physical, chart, labs and discussed the procedure including the risks, benefits and alternatives for the proposed anesthesia with the patient or authorized representative who has indicated his/her understanding and acceptance.       Plan Discussed with:   Anesthesia Plan Comments:         Anesthesia Quick Evaluation

## 2018-11-17 NOTE — Anesthesia Procedure Notes (Signed)
Epidural Patient location during procedure: OB  Staffing Anesthesiologist: Nolon Nations, MD Performed: anesthesiologist   Preanesthetic Checklist Completed: patient identified, pre-op evaluation, timeout performed, IV checked, risks and benefits discussed and monitors and equipment checked  Epidural Patient position: sitting Prep: site prepped and draped and DuraPrep Patient monitoring: heart rate, blood pressure and continuous pulse ox Approach: midline Location: L3-L4 Injection technique: LOR air and LOR saline  Needle:  Needle type: Tuohy  Needle gauge: 17 G Needle length: 9 cm Needle insertion depth: 5.5 cm Catheter type: closed end flexible Catheter size: 19 Gauge Catheter at skin depth: 11 cm Test dose: negative  Assessment Sensory level: T8 Events: blood not aspirated, injection not painful, no injection resistance, negative IV test and no paresthesia  Additional Notes Reason for block:procedure for pain

## 2018-11-17 NOTE — MAU Note (Signed)
Pt reports water broke at 1845, clear/pink. Continues to have trickling. She reports some tightening in abdomen. Reports good fetal movement. Cervix was 3/75% on Wednesday. Pt reports she is scheduled for a c/s on Wednesday, but would like TOLAC.

## 2018-11-17 NOTE — H&P (Signed)
Kaylee [name] is a 38 y.o. female presenting for SROM and labor sxs.  Scheduled for repeat c/s next week but she now wants TOLAC.  First c/s was for arrest of descent of large baby and she thinks this one is smaller and wants to try for vaginal delivery.  GBS-  No sxs of HSV ( on valtrex). OB History    Gravida  2   Para  1   Term  1   Preterm      AB      Living  1     SAB      TAB      Ectopic      Multiple      Live Births  1          Past Medical History:  Diagnosis Date  . Anemia   . Headache(784.0)    migraines   Past Surgical History:  Procedure Laterality Date  . CESAREAN SECTION N/A 04/16/2013   Procedure: CESAREAN SECTION;  Surgeon: Allena Katz, MD;  Location: [facility];  Service: Obstetrics;  Laterality: N/A;  . TUMOR REMOVAL  2006   benign from neck   Family History: family history includes Hemochromatosis in her father; Hypertension in her mother and sister; Thalassemia in her father. Social History:  reports that she has never smoked. She has never used smokeless tobacco. She reports that she does not drink alcohol or use drugs.     Maternal Diabetes: No Genetic Screening: Normal Maternal Ultrasounds/Referrals: Normal Fetal Ultrasounds or other Referrals:  None Maternal Substance Abuse:  No Significant Maternal Medications:  None Significant Maternal Lab Results:  Group B Strep negative Other Comments:  None  ROS History Dilation: 4 Effacement (%): 80 Station: -2 Exam by:: [name] RN Blood pressure (!) 148/65, pulse 79, temperature 98.3 F (36.8 C), temperature source Oral, resp. rate 18, height 5\' 5"  (1.651 m), weight 89.4 kg, last menstrual period 02/20/2018, SpO2 98 %, unknown if currently breastfeeding. Exam Physical Exam  Prenatal labs: ABO, Rh: --/--/PENDING, B POS Performed at [facility], [place] [address], [place], [place] [address]  [id number] 2051) Antibody: PENDING (10/18 2051) Rubella:   RPR: Nonreactive  (03/25 0000)  HBsAg: Negative (03/25 0000)  HIV: Non-reactive (03/25 0000)  GBS: Negative/-- (09/23 0000)   Assessment/Plan: IUP at term Active labor and SROM Prev C/S desires TOLAC.  R&B discussed at length and informed consent obtained.   Luz Lex 11/17/2018, 9:58 PM

## 2018-11-18 ENCOUNTER — Encounter (HOSPITAL_COMMUNITY): Payer: Self-pay

## 2018-11-18 ENCOUNTER — Other Ambulatory Visit (HOSPITAL_COMMUNITY)
Admission: RE | Admit: 2018-11-18 | Discharge: 2018-11-18 | Disposition: A | Discharge status: Home / Self Care | Payer: BC Managed Care – PPO | Source: Ambulatory Visit

## 2018-11-18 LAB — CBC
HCT: 30.4 % — ABNORMAL LOW (ref 36.0–46.0)
Hemoglobin: 9.6 g/dL — ABNORMAL LOW (ref 12.0–15.0)
MCH: 22 pg — ABNORMAL LOW (ref 26.0–34.0)
MCHC: 31.6 g/dL (ref 30.0–36.0)
MCV: 69.7 fL — ABNORMAL LOW (ref 80.0–100.0)
Platelets: 231 10*3/uL (ref 150–400)
RBC: 4.36 MIL/uL (ref 3.87–5.11)
RDW: 14.6 % (ref 11.5–15.5)
WBC: 22.4 10*3/uL — ABNORMAL HIGH (ref 4.0–10.5)
nRBC: 0 % (ref 0.0–0.2)

## 2018-11-18 LAB — ABO/RH: ABO/RH(D): B POS

## 2018-11-18 LAB — RPR: RPR Ser Ql: NONREACTIVE

## 2018-11-18 MED ORDER — IBUPROFEN 600 MG PO TABS
600.0000 mg | ORAL_TABLET | Freq: Four times a day (QID) | ORAL | Status: DC
  Administered 2018-11-18 – 2018-11-19 (×5): 600 mg via ORAL
  Filled 2018-11-18 (×5): qty 1

## 2018-11-18 MED ORDER — COCONUT OIL OIL: 1.0000 "application " | TOPICAL_OIL | Status: DC | PRN

## 2018-11-18 MED ORDER — PRENATAL MULTIVITAMIN CH
1.0000 | ORAL_TABLET | Freq: Every day | ORAL | Status: DC
  Administered 2018-11-18: 1 via ORAL
  Filled 2018-11-18: qty 1

## 2018-11-18 MED ORDER — MEASLES, MUMPS & RUBELLA VAC IJ SOLR: 0.5000 mL | Freq: Once | INTRAMUSCULAR | Status: DC

## 2018-11-18 MED ORDER — SIMETHICONE 80 MG PO CHEW: 80.0000 mg | CHEWABLE_TABLET | ORAL | Status: DC | PRN

## 2018-11-18 MED ORDER — OXYCODONE-ACETAMINOPHEN 5-325 MG PO TABS: 2.0000 | ORAL_TABLET | ORAL | Status: DC | PRN

## 2018-11-18 MED ORDER — TETANUS-DIPHTH-ACELL PERTUSSIS 5-2.5-18.5 LF-MCG/0.5 IM SUSP: 0.5000 mL | Freq: Once | INTRAMUSCULAR | Status: DC

## 2018-11-18 MED ORDER — DIBUCAINE (PERIANAL) 1 % EX OINT: 1.0000 "application " | TOPICAL_OINTMENT | CUTANEOUS | Status: DC | PRN

## 2018-11-18 MED ORDER — ZOLPIDEM TARTRATE 5 MG PO TABS: 5.0000 mg | ORAL_TABLET | Freq: Every evening | ORAL | Status: DC | PRN

## 2018-11-18 MED ORDER — ONDANSETRON HCL 4 MG/2ML IJ SOLN: 4.0000 mg | INTRAMUSCULAR | Status: DC | PRN

## 2018-11-18 MED ORDER — OXYCODONE-ACETAMINOPHEN 5-325 MG PO TABS: 1.0000 | ORAL_TABLET | ORAL | Status: DC | PRN

## 2018-11-18 MED ORDER — MEDROXYPROGESTERONE ACETATE 150 MG/ML IM SUSP: 150.0000 mg | INTRAMUSCULAR | Status: DC | PRN

## 2018-11-18 MED ORDER — ACETAMINOPHEN 325 MG PO TABS: 650.0000 mg | ORAL_TABLET | ORAL | Status: DC | PRN

## 2018-11-18 MED ORDER — BENZOCAINE-MENTHOL 20-0.5 % EX AERO
1.0000 "application " | INHALATION_SPRAY | CUTANEOUS | Status: DC | PRN
  Administered 2018-11-18 – 2018-11-19 (×2): 1 via TOPICAL
  Filled 2018-11-18 (×2): qty 56

## 2018-11-18 MED ORDER — ONDANSETRON HCL 4 MG PO TABS: 4.0000 mg | ORAL_TABLET | ORAL | Status: DC | PRN

## 2018-11-18 MED ORDER — DIPHENHYDRAMINE HCL 25 MG PO CAPS: 25.0000 mg | ORAL_CAPSULE | Freq: Four times a day (QID) | ORAL | Status: DC | PRN

## 2018-11-18 MED ORDER — WITCH HAZEL-GLYCERIN EX PADS: 1.0000 "application " | MEDICATED_PAD | CUTANEOUS | Status: DC | PRN

## 2018-11-18 MED ORDER — SENNOSIDES-DOCUSATE SODIUM 8.6-50 MG PO TABS
2.0000 | ORAL_TABLET | ORAL | Status: DC
  Administered 2018-11-18: 2 via ORAL
  Filled 2018-11-18: qty 2

## 2018-11-18 NOTE — Lactation Note (Signed)
This note was copied from a baby's chart. Lactation Consultation Note  Patient Name: Boy Rhilee Currin [id number] Date: 11/18/2018 Reason for consult: Initial assessment   P2 mom.  2.5 yrs. Bf exp. With 36 year old.  No issues bf first child.  Denies concerns or questions at this time.    LC provided lactation brochure and shared info about bfsg, op services, and phone line.    BF basics reviewed with mom; STS, hand exp. Prior to and after feeding, at least 8-12 feeds in 24 hours, and good head/neck support when latching newborn.  Encouraged family to call out with questions or concerns.    Maternal Data Has patient been taught Hand Expression?: No Does the patient have breastfeeding experience prior to this delivery?: Yes  Feeding Feeding Type: Breast Fed  LATCH Score Latch: Grasps breast easily, tongue down, lips flanged, rhythmical sucking.  Audible Swallowing: A few with stimulation  Type of Nipple: Everted at rest and after stimulation  Comfort (Breast/Nipple): Soft / non-tender  Hold (Positioning): No assistance needed to correctly position infant at breast.  LATCH Score: 9  Interventions Interventions: Breast feeding basics reviewed  Lactation Tools Discussed/Used     Consult Status Consult Status: Follow-up Date: 11/19/18 Follow-up type: In-patient    Ferne Coe [facility] 11/18/2018, 11:50 AM

## 2018-11-18 NOTE — Anesthesia Postprocedure Evaluation (Signed)
Anesthesia Post Note  Patient: Kaylee [name]  Procedure(s) Performed: AN AD HOC LABOR EPIDURAL     Patient location during evaluation: Mother Baby Anesthesia Type: Epidural Level of consciousness: awake Pain management: satisfactory to patient Vital Signs Assessment: post-procedure vital signs reviewed and stable Respiratory status: spontaneous breathing Cardiovascular status: stable Anesthetic complications: no    Last Vitals:  Vitals:   11/18/18 0630 11/18/18 1014  BP: 137/72 116/69  Pulse: 78 77  Resp: 18 18  Temp: 36.6 C 36.7 C  SpO2:  98%    Last Pain:  Vitals:   11/18/18 1448  TempSrc:   PainSc: 0-No pain   Pain Goal: Patients Stated Pain Goal: 0 (11/17/18 2300)                 Casimer Lanius

## 2018-11-18 NOTE — Progress Notes (Signed)
Post Partum Day 0 Subjective: no complaints, up ad lib, voiding and tolerating PO  Objective: Blood pressure 137/72, pulse 78, temperature 97.9 F (36.6 C), temperature source Oral, resp. rate 18, height 5\' 5"  (1.651 m), weight 89.4 kg, last menstrual period 02/20/2018, SpO2 99 %, unknown if currently breastfeeding.  Physical Exam:  General: alert, cooperative and appears stated age Lochia: appropriate Uterine Fundus: firm Incision: healing well, no significant drainage, no dehiscence, no significant erythema DVT Evaluation: No evidence of DVT seen on physical exam. Negative Homan's sign. No cords or calf tenderness. No significant calf/ankle edema.  Recent Labs    11/17/18 2051 11/18/18 0610  HGB 10.2* 9.6*  HCT 31.6* 30.4*    Assessment/Plan: Plan for discharge tomorrow, Breastfeeding and Circumcision prior to discharge. Baby < 8 hours old. Won't be ready for circ until tomorrow.    LOS: 1 day   Tyson Dense 11/18/2018, 9:45 AM

## 2018-11-19 MED ORDER — IBUPROFEN 600 MG PO TABS: 600.0000 mg | ORAL_TABLET | Freq: Four times a day (QID) | ORAL | 0 refills | Status: AC

## 2018-11-19 NOTE — Discharge Instructions (Signed)
Call MD for T>100.4, heavy vaginal bleeding, severe abdominal pain, or respiratory distress.  Call office to schedule postpartum visit in 6 weeks.  Pelvic rest x 6 weeks.   

## 2018-11-19 NOTE — Lactation Note (Signed)
This note was copied from a baby's chart. Lactation Consultation Note  Patient Name: Kaylee [name] [id number] Date: 11/19/2018   Baby 49 hours old.  Ex BF.  Baby recently back from circ. Reviewed cluster feeding. Reviewed engorgement care and monitoring voids/stools. Feed on demand with cues.  Goal 8-12+ times per day after first 24 hrs.  Place baby STS if not cueing.  Suggest calling if assistance is needed. Mother denies questions or concerns.      Maternal Data    Feeding    LATCH Score                   Interventions    Lactation Tools Discussed/Used     Consult Status      Carlye Grippe 11/19/2018, 10:27 AM

## 2018-11-19 NOTE — Progress Notes (Signed)
Post Partum Day 1 Subjective: no complaints, up ad lib, voiding and tolerating PO.  Patient would like d/c home today and circ prior to d/c.  Objective: Blood pressure 119/71, pulse 66, temperature 97.7 F (36.5 C), temperature source Axillary, resp. rate 16, height 5\' 5"  (1.651 m), weight 89.4 kg, last menstrual period 02/20/2018, SpO2 98 %, unknown if currently breastfeeding.  Physical Exam:  General: alert, cooperative and appears stated age Lochia: appropriate Uterine Fundus: firm Incision: healing well, no significant drainage, no dehiscence DVT Evaluation: No evidence of DVT seen on physical exam. Negative Homan's sign. No cords or calf tenderness.  Recent Labs    11/17/18 2051 11/18/18 0610  HGB 10.2* 9.6*  HCT 31.6* 30.4*    Assessment/Plan: Discharge home and Circumcision prior to discharge  Patient is counseled re: circ including risk of bleeding, infection, and scarring.  All questions were answered and the patient wishes to proceed.   LOS: 2 days   Linda Hedges 11/19/2018, 9:21 AM

## 2018-11-19 NOTE — Discharge Summary (Signed)
Obstetric Discharge Summary Reason for Admission: onset of labor/TOLAC Prenatal Procedures: none Intrapartum Procedures: VBAC Postpartum Procedures: none Complications-Operative and Postpartum: 2nd degree perineal laceration Hemoglobin  Date Value Ref Range Status  11/18/2018 9.6 (L) 12.0 - 15.0 g/dL Final   HGB  Date Value Ref Range Status  10/28/2012 10.2 (L) 11.6 - 15.9 g/dL Final   HCT  Date Value Ref Range Status  11/18/2018 30.4 (L) 36.0 - 46.0 % Final  10/28/2012 31.0 (L) 34.8 - 46.6 % Final    Physical Exam:  General: alert, cooperative and appears stated age 38: appropriate Uterine Fundus: firm Incision: healing well, no significant drainage, no dehiscence DVT Evaluation: No evidence of DVT seen on physical exam. Negative Homan's sign. No cords or calf tenderness.  Discharge Diagnoses: Term Pregnancy-delivered and VBAC  Discharge Information: Date: 11/19/2018 Activity: pelvic rest Diet: routine Medications: PNV and Ibuprofen Condition: stable Instructions: refer to practice specific booklet Discharge to: home   Newborn Data: Live born female  Birth Weight: 7 lb 0.2 oz (3180 g) APGAR: 8, 9  Newborn Delivery   Birth date/time: 11/18/2018 02:55:00 Delivery type: VBAC, Spontaneous      Home with mother.  Linda Hedges 11/19/2018, 9:24 AM

## 2018-11-20 ENCOUNTER — Inpatient Hospital Stay (HOSPITAL_COMMUNITY)
Admission: RE | Admit: 2018-11-20 | Payer: BC Managed Care – PPO | Source: Home / Self Care | Admitting: Obstetrics and Gynecology

## 2018-11-20 ENCOUNTER — Encounter (HOSPITAL_COMMUNITY): Admission: RE | Payer: Self-pay | Source: Home / Self Care

## 2018-11-20 SURGERY — Surgical Case
Anesthesia: Regional
